# Patient Record
Sex: Female | Born: 1950
Health system: Southern US, Community
[De-identification: ages and names within clinical notes are randomized; demographics above are authoritative.]

## PROBLEM LIST (undated history)

## (undated) DIAGNOSIS — F419 Anxiety disorder, unspecified: Secondary | ICD-10-CM

## (undated) DIAGNOSIS — M810 Age-related osteoporosis without current pathological fracture: Secondary | ICD-10-CM

## (undated) DIAGNOSIS — H269 Unspecified cataract: Secondary | ICD-10-CM

## (undated) DIAGNOSIS — E079 Disorder of thyroid, unspecified: Secondary | ICD-10-CM

## (undated) DIAGNOSIS — T7840XA Allergy, unspecified, initial encounter: Secondary | ICD-10-CM

## (undated) DIAGNOSIS — M1990 Unspecified osteoarthritis, unspecified site: Secondary | ICD-10-CM

## (undated) DIAGNOSIS — E7800 Pure hypercholesterolemia, unspecified: Secondary | ICD-10-CM

## (undated) DIAGNOSIS — E039 Hypothyroidism, unspecified: Secondary | ICD-10-CM

## (undated) DIAGNOSIS — E785 Hyperlipidemia, unspecified: Secondary | ICD-10-CM

## (undated) HISTORY — DX: Age-related osteoporosis without current pathological fracture: M81.0

## (undated) HISTORY — DX: Hypothyroidism, unspecified: E03.9

## (undated) HISTORY — DX: Pure hypercholesterolemia, unspecified: E78.00

## (undated) HISTORY — PX: OTHER SURGICAL HISTORY: SHX169

## (undated) HISTORY — DX: Anxiety disorder, unspecified: F41.9

## (undated) HISTORY — DX: Unspecified osteoarthritis, unspecified site: M19.90

## (undated) HISTORY — DX: Hyperlipidemia, unspecified: E78.5

## (undated) HISTORY — DX: Unspecified cataract: H26.9

## (undated) HISTORY — DX: Allergy, unspecified, initial encounter: T78.40XA

## (undated) HISTORY — DX: Disorder of thyroid, unspecified: E07.9

---

## 2000-02-18 ENCOUNTER — Encounter: Admission: RE | Admit: 2000-02-18 | Discharge: 2000-02-18 | Payer: Self-pay | Admitting: Family Medicine

## 2000-02-18 ENCOUNTER — Encounter: Payer: Self-pay | Admitting: Family Medicine

## 2000-09-22 ENCOUNTER — Encounter: Payer: Self-pay | Admitting: Family Medicine

## 2000-09-22 ENCOUNTER — Encounter: Admission: RE | Admit: 2000-09-22 | Discharge: 2000-09-22 | Payer: Self-pay | Admitting: Family Medicine

## 2000-12-26 ENCOUNTER — Encounter (INDEPENDENT_AMBULATORY_CARE_PROVIDER_SITE_OTHER): Payer: Self-pay | Admitting: Specialist

## 2000-12-26 ENCOUNTER — Other Ambulatory Visit: Admission: RE | Admit: 2000-12-26 | Discharge: 2000-12-26 | Payer: Self-pay | Admitting: Obstetrics and Gynecology

## 2001-08-31 ENCOUNTER — Emergency Department (HOSPITAL_COMMUNITY): Admission: EM | Admit: 2001-08-31 | Discharge: 2001-08-31 | Payer: Self-pay | Admitting: *Deleted

## 2001-08-31 ENCOUNTER — Encounter: Payer: Self-pay | Admitting: Emergency Medicine

## 2002-12-19 ENCOUNTER — Encounter: Payer: Self-pay | Admitting: Family Medicine

## 2002-12-19 ENCOUNTER — Ambulatory Visit (HOSPITAL_COMMUNITY): Admission: RE | Admit: 2002-12-19 | Discharge: 2002-12-19 | Payer: Self-pay | Admitting: Family Medicine

## 2003-04-29 ENCOUNTER — Encounter: Payer: Self-pay | Admitting: Family Medicine

## 2003-04-29 ENCOUNTER — Encounter: Admission: RE | Admit: 2003-04-29 | Discharge: 2003-04-29 | Payer: Self-pay | Admitting: Family Medicine

## 2003-04-29 ENCOUNTER — Encounter: Payer: Self-pay | Admitting: Radiology

## 2003-05-20 ENCOUNTER — Encounter: Admission: RE | Admit: 2003-05-20 | Discharge: 2003-05-20 | Payer: Self-pay | Admitting: Family Medicine

## 2003-05-20 ENCOUNTER — Encounter: Payer: Self-pay | Admitting: Family Medicine

## 2003-06-09 ENCOUNTER — Encounter: Admission: RE | Admit: 2003-06-09 | Discharge: 2003-06-09 | Payer: Self-pay | Admitting: Family Medicine

## 2003-06-09 ENCOUNTER — Encounter: Payer: Self-pay | Admitting: Family Medicine

## 2005-11-12 ENCOUNTER — Encounter: Admission: RE | Admit: 2005-11-12 | Discharge: 2005-11-12 | Payer: Self-pay | Admitting: Family Medicine

## 2010-08-20 ENCOUNTER — Emergency Department (HOSPITAL_COMMUNITY): Admission: EM | Admit: 2010-08-20 | Discharge: 2010-08-21 | Payer: Self-pay | Admitting: Emergency Medicine

## 2010-08-27 ENCOUNTER — Ambulatory Visit (HOSPITAL_COMMUNITY): Payer: Self-pay | Admitting: Psychology

## 2010-09-02 ENCOUNTER — Ambulatory Visit (HOSPITAL_COMMUNITY): Payer: Self-pay | Admitting: Psychology

## 2011-01-12 LAB — ETHANOL

## 2011-01-12 LAB — SALICYLATE LEVEL: Salicylate Lvl: <4 mg/dL (ref 2.8–20.0)

## 2011-01-12 LAB — DIFFERENTIAL
Basophils Relative: 1 % (ref 0–1)
Eosinophils Absolute: 0.1 10*3/uL (ref 0.0–0.7)
Eosinophils Relative: 2 % (ref 0–5)
Monocytes Absolute: 0.5 10*3/uL (ref 0.1–1.0)
Monocytes Relative: 8 % (ref 3–12)

## 2011-01-12 LAB — COMPREHENSIVE METABOLIC PANEL
ALT: 21 U/L (ref 0–35)
AST: 27 U/L (ref 0–37)
Albumin: 4.3 g/dL (ref 3.5–5.2)
Alkaline Phosphatase: 98 U/L (ref 39–117)
Chloride: 106 mEq/L (ref 96–112)
Potassium: 3.7 mEq/L (ref 3.5–5.1)
Sodium: 139 mEq/L (ref 135–145)
Total Bilirubin: 0.6 mg/dL (ref 0.3–1.2)
Total Protein: 7 g/dL (ref 6.0–8.3)

## 2011-01-12 LAB — CBC
HCT: 43.9 % (ref 36.0–46.0)
Hemoglobin: 14.3 g/dL (ref 12.0–15.0)
MCHC: 32.6 g/dL (ref 30.0–36.0)
MCV: 88.9 fL (ref 78.0–100.0)
WBC: 6 10*3/uL (ref 4.0–10.5)

## 2011-01-12 LAB — CK TOTAL AND CKMB (NOT AT ARMC)
CK, MB: 0.6 ng/mL (ref 0.3–4.0)
Relative Index: INVALID (ref 0.0–2.5)
Total CK: 70 U/L (ref 7–177)

## 2011-01-12 LAB — RAPID URINE DRUG SCREEN, HOSP PERFORMED
Barbiturates: NOT DETECTED
Cocaine: NOT DETECTED
Opiates: NOT DETECTED

## 2011-01-12 LAB — APTT: aPTT: 28 seconds (ref 24–37)

## 2013-03-01 ENCOUNTER — Ambulatory Visit (INDEPENDENT_AMBULATORY_CARE_PROVIDER_SITE_OTHER): Payer: Medicare Other | Admitting: Neurology

## 2013-03-01 ENCOUNTER — Encounter: Payer: Self-pay | Admitting: Neurology

## 2013-03-01 VITALS — BP 117/71 | HR 79 | Ht 65.5 in | Wt 157.0 lb

## 2013-03-01 DIAGNOSIS — R569 Unspecified convulsions: Secondary | ICD-10-CM

## 2013-03-01 DIAGNOSIS — F4310 Post-traumatic stress disorder, unspecified: Secondary | ICD-10-CM

## 2013-03-01 DIAGNOSIS — IMO0001 Reserved for inherently not codable concepts without codable children: Secondary | ICD-10-CM

## 2013-03-01 HISTORY — DX: Post-traumatic stress disorder, unspecified: F43.10

## 2013-03-01 NOTE — Progress Notes (Signed)
[facility]  Provider:  Dr Adonis Ryther Referring Provider: Glori Bickers, MD Primary Care Physician:  Pamelia Hoit, MD  Chief Complaint  Patient presents with  . Neurologic Problem    Sleep Consult...RM#10    HPI:  Ruth Jennings is a 62 y.o. female here as a referral from Dr. Evelene Croon for Ruth Jennings had been [facility] November of 15 2011 by Dr. Delia Heady. Or referral to Dr. said said he was based on a cold stroke in 2011 then the [facility]. The stroke was found to have non-organic features and therefore was considered a possible conversion disorder. The psychiatrist on service saw the patient at the time and confirmed multiform this order. CT angiogram was attempted but was not was suboptimal to technical reasons.  She was sent home but has had persistent intermittent speech difficulties off associated with a left facial droop and left-sided weakness have been spells but her husband describes as loss of muscle on with a loss of awareness. The patient confirmed that she can hear and is able to absorb stimuli but she's not able to respond or react to cells.  The patient is now under psychiatric care of is Dr. Evelene Croon. Dr. Westley Chandler referred the patient for a sudden onset of difficulties with speech articulation followed by loss of muscle tone and drop attacks. Onset of some of these drop attacks seems to be related to postural changes. The patient also confirmed that she has normal sleep and a decreased appetite for over a decade but she has gotten better the last 2 years.    The the patient's husband confirms that there seemed to be possible this autonomic changes in orthostatic fainting is. The social history of the patient is as follows the patient is married she lost one son to suicide and her breasts and event has experienced a traumatic event. The patient became after this the band unable to drive unable to  participate in any social or employment home required activities and has not been working or not in a gainfully employed positions. She is to this day not able to keep house or her from other activities of independent daily living.   Review of Systems: Out of a complete 14 system review, the patient complains of only the following symptoms, and all other reviewed systems are negative.  drop attacks, unrelated to emotion. NOT CATAPLECTIC.  Possible postural/ Orthostatic.  Psychogenic  Eye openig apraxia and speech disorder.    History   Social History  . Marital Status: Single    Spouse Name: N/A    Number of Children: 1  . Years of Education: HS   Occupational History  . BUSINESS OWNER Other    OWN/EMPLOYED AT [employer]   Social History Main Topics  . Smoking status: Never Smoker   . Smokeless tobacco: Not on file  . Alcohol Use: No  . Drug Use: No  . Sexually Active: Not on file   Other Topics Concern  . Not on file   Social History Narrative  . No narrative on file    Family History  Problem Relation Age of Onset  . COPD Father     Past Medical History  Diagnosis Date  . Thyroid disorder   . High cholesterol   . Anxiety     No past surgical history on file.  Current Outpatient Prescriptions  Medication Sig Dispense Refill  .  ALPRAZolam (XANAX) 1 MG tablet Take 1 mg by mouth at bedtime as needed for sleep.      . cyclobenzaprine (FLEXERIL) 10 MG tablet Take 10 mg by mouth 3 (three) times daily as needed for muscle spasms.      Marland Kitchen FLUoxetine (PROZAC) 20 MG capsule Take 20 mg by mouth daily.      Marland Kitchen levothyroxine (SYNTHROID, LEVOTHROID) 75 MCG tablet Take 75 mcg by mouth daily before breakfast.      . pravastatin (PRAVACHOL) 40 MG tablet Take 40 mg by mouth daily.       No current facility-administered medications for this visit.    Allergies as of 03/01/2013  . (No Known Allergies)    Vitals: BP 117/71  Pulse 79  Ht 5' 5.5" (1.664 m)  Wt 157  lb (71.215 kg)  BMI 25.72 kg/m2 Last Weight:  Wt Readings from Last 1 Encounters:  03/01/13 157 lb (71.215 kg)   Last Height:   Ht Readings from Last 1 Encounters:  03/01/13 5' 5.5" (1.664 m)   Physical exam:  General: The patient is  Dazed-  The patient is well groomed. She displayed a spell with  Eye opening apraxia, regressive voice, and rapid breathing here in the office.,  Head: Normocephalic, atraumatic. Neck is supple. Mallampati 2 , neck circumference:14.5 Cardiovascular:  Regular rate and rhythm, without  murmurs or carotid bruit, and without distended neck veins. Respiratory: Lungs are clear to auscultation. Skin:  Without evidence of edema, or rash Trunk: BMI is normal.   patient  Has slumped posture.  Neurologic exam : The patient is awake and alert, oriented to place and time.  Memory subjective impaired , and patient is unable to multitask.  described as intact. There is a normal attention span & concentration ability. Speech is non- fluent without dysarthria, dysphonia or aphasia. Somatiform, conversion.  Mood and affect are inappropriate.  Cranial nerves: Pupils are equal and briskly reactive to light.Extraocular movements  in vertical and horizontal planes intact and without nystagmus. Visual fields by finger perimetry are intact. Hearing to finger rub intact.  Facial sensation intact to fine touch. Facial motor strength is symmetric and tongue and uvula move midline.  Motor exam:  Normal tone and normal muscle bulk and symmetric normal strength in all extremities.  Sensory:  Fine touch, pinprick and vibration were tested in all extremities.   Coordination: Rapid alternating movements in the fingers/hands is tested and normal. Finger-to-nose maneuver deferred.  Gait and station: Unable to comply.   PLAN- no assessment for cataplexy necessary , please follow with psychology/ psychiatry.

## 2013-03-01 NOTE — Patient Instructions (Signed)
return to psychiatry.

## 2015-05-25 ENCOUNTER — Other Ambulatory Visit (HOSPITAL_COMMUNITY): Payer: Self-pay | Admitting: Family Medicine

## 2015-05-25 DIAGNOSIS — Z1231 Encounter for screening mammogram for malignant neoplasm of breast: Secondary | ICD-10-CM

## 2015-06-15 ENCOUNTER — Ambulatory Visit (HOSPITAL_COMMUNITY)
Admission: RE | Admit: 2015-06-15 | Discharge: 2015-06-15 | Disposition: A | Discharge status: Home / Self Care | Payer: PPO | Source: Ambulatory Visit | Attending: Family Medicine | Admitting: Family Medicine

## 2015-06-15 DIAGNOSIS — Z1231 Encounter for screening mammogram for malignant neoplasm of breast: Secondary | ICD-10-CM | POA: Diagnosis present

## 2015-07-16 ENCOUNTER — Telehealth: Payer: Self-pay | Admitting: Neurology

## 2015-07-16 NOTE — Telephone Encounter (Signed)
I talked to the patient's psychiatrist, Dr. Toy Care today about this patient. She had seen Dr. Leonie Man in the past for TIA workup as I understand. She then saw Dr. Brett Fairy in May 2014 for falling out spells, concern for cataplexy. The patient has a history of depression and prior concern for conversion disorder, for which she was advised to seek psychiatric follow-up in counseling. She has not received counseling on a regular basis according to the psychiatrist. When she was in Dr. Starleen Arms office recently she suddenly had what sounds like speech arrest and fell out of the chair. Dr. Toy Care is requesting further advice about how to proceed with the patient. I suggested it may be best for the patient to see Dr. Brett Fairy again. I did advise Dr. Toy Care, that the description of the event and the timeline of the event sounded less likely than a cataplectic events but a syncopal event or convulsive event could not be excluded. Dr. Toy Care had a brief video clip captured on her phone and asked for work email to forward the video clip for further description. I gave her Dr. Edwena Felty work Leisure centre manager. I did ask her to send a referral so that patient could be scheduled again.

## 2015-11-30 ENCOUNTER — Ambulatory Visit (INDEPENDENT_AMBULATORY_CARE_PROVIDER_SITE_OTHER): Payer: PPO | Admitting: Neurology

## 2015-11-30 ENCOUNTER — Encounter: Payer: Self-pay | Admitting: Neurology

## 2015-11-30 VITALS — BP 120/80 | HR 58 | Resp 20 | Ht 66.0 in | Wt 138.0 lb

## 2015-11-30 DIAGNOSIS — G47411 Narcolepsy with cataplexy: Secondary | ICD-10-CM

## 2015-11-30 DIAGNOSIS — F482 Pseudobulbar affect: Secondary | ICD-10-CM

## 2015-11-30 DIAGNOSIS — R0683 Snoring: Secondary | ICD-10-CM

## 2015-11-30 DIAGNOSIS — R5382 Chronic fatigue, unspecified: Secondary | ICD-10-CM | POA: Diagnosis not present

## 2015-11-30 MED ORDER — IMIPRAMINE HCL 25 MG PO TABS: 25.0000 mg | ORAL_TABLET | Freq: Every day | ORAL | Status: DC

## 2015-11-30 NOTE — Progress Notes (Signed)
Ruth Age, MD ; I talked to the patient's psychiatrist, Dr. Toy Care today about this patient. She had seen Dr. Leonie Jennings in the past for TIA workup as I understand. She then saw Dr. Brett Jennings in May 2014 for falling out spells, concern for cataplexy. The patient has a history of depression and prior concern for conversion disorder, for which she was advised to seek psychiatric follow-up in counseling. She has not received counseling on a regular basis according to the psychiatrist. When she was in Dr. Starleen Arms office recently she suddenly had what sounds like speech arrest and fell out of the chair. Dr. Toy Care is requesting further advice about how to proceed with the patient. I suggested it may be best for the patient to see Dr. Brett Jennings again. I did advise Dr. Toy Care, that the description of the event and the timeline of the event sounded less likely than a cataplectic events but a syncopal event or convulsive event could not be excluded. Dr. Toy Care had a brief video clip captured on her phone and asked for work e-mail to forward the video clip for further description. I gave her Dr. Edwena Jennings work e-mail. I did ask her to send a referral so that patient could be scheduled again.    [facility]   Provider:  Larey Seat, M D  Referring Provider: Dr Toy Care.  Primary Care Physician:  Ruth Seller, MD  Chief Complaint  Patient presents with  . New Patient (Initial Visit)    saw dr. Brett Jennings in 2014, dr. Toy Care referred her back, thinks she may have cataplexy, rm 10, alone    HPI:  Ruth Jennings is a 65 y.o. female , seen here as a referral from Dr. Toy Care ,   Chief complaint according to patient : "something has taken over my body" I have trouble walking and talking ", "I stay indoors,  haven't driven in 6 years". "i don't know when a spell is coming'. " my mornings are really bad"   [name] described spells during which she will hear everything but is unable to answer.  He spells occur  randomly without aura , this seems to be no sequence. There are isolated and do not appear in cuter that do not have a preferred time of day or relationship to her mealtime. Spells are coming on for 6 years now, and Dr Toy Care send a video of a possible cataplectic event.  The spells are also coming on at night while the patient is in bed, she wakes up from these. She starts moaning and louder and louder she gets. The spells at night come on at 2-3 AM lasts much longer, lasting over an hour.   Sleep habits are as follows: The patient goes to bed at 11 PM, sleeping in a separate room from her husband. She denies being afraid of going to bed, [name] describes her bedroom as core, quiet and dark. She likes to watch TV in her bedroom but the machine usually switches off by the time she goes to sleep. She usually goes to bed at 11, the attacks come between 2- 3 AM but not every night, she has no nocturia. Her spouse reports her snoring, has not noted any apnea.  Rises between 5 and 6, some nights with only 4 hours of sleep. Uses Xanax  In oral dissolving form at night if she has trouble to sleep. She never naps.  She is very fatigued but not sleepy, has endorsed the Epworth score at  zero, the FSS at 59 points. She is not happy to be here.   Sleep medical history and family sleep history:  6 years of spells.   Social history: married, homebound by fear.  No ETOH, Caffeine , No tobacco use.   Review of Systems: Out of a complete 14 system review, the patient complains of only the following symptoms, and all other reviewed systems are negative.  #1 fatigue , snoring , fatigue, memory loss , confusion , headache , slurred speech , dysphonia seizures, depression, anxiety, loss of energy, death interest, fear not enough sleep, runny nose. Past medical history anxiety and depression and high cholesterol were endorsed.  Epworth score 0 , Fatigue severity score 59  , depression score deferred.    Social  History   Social History  . Marital Status: Single    Spouse Name: N/A  . Number of Children: 1  . Years of Education: HS   Occupational History  . BUSINESS OWNER Other    OWN/EMPLOYED AT [employer]   Social History Main Topics  . Smoking status: Never Smoker   . Smokeless tobacco: Not on file  . Alcohol Use: No  . Drug Use: No  . Sexual Activity: Not on file   Other Topics Concern  . Not on file   Social History Narrative    Family History  Problem Relation Jennings of Onset  . COPD Father     Past Medical History  Diagnosis Date  . Thyroid disorder   . High cholesterol   . Anxiety     History reviewed. No pertinent past surgical history.  Current Outpatient Prescriptions  Medication Sig Dispense Refill  . ALPRAZolam (XANAX) 1 MG tablet Take 1 mg by mouth at bedtime as needed for sleep.    Marland Kitchen levothyroxine (SYNTHROID, LEVOTHROID) 75 MCG tablet Take 75 mcg by mouth daily before breakfast.    . pravastatin (PRAVACHOL) 40 MG tablet Take 40 mg by mouth daily.     No current facility-administered medications for this visit.    Allergies as of 11/30/2015  . (No Known Allergies)    Vitals: BP 120/80 mmHg  Pulse 58  Resp 20  Ht _0  (1.676 m)  Wt 138 lb (62.596 kg)  BMI 22.28 kg/m2 Last Weight:  Wt Readings from Last 1 Encounters:  11/30/15 138 lb (62.596 kg)   QMG:QQPY mass index is 22.28 kg/(m^2).     Last Height:   Ht Readings from Last 1 Encounters:  11/30/15 _1  (1.676 m)    Physical exam:  General: The patient is awake, alert and appears not in acute distress. The patient is well groomed. Head: Normocephalic, atraumatic. Neck is supple. Mallampati  neck circumference:14 . Nasal airflow unrestricted, TMJ is not evident . Retrognathia is not seen.  Cardiovascular:  Regular rate and rhythm, without  murmurs or carotid bruit, and without distended neck veins. Respiratory: Lungs are clear to auscultation. Skin:  Without evidence of edema, or  rash Trunk: BMI is normal  The patient's posture is erect  Neurologic exam : The patient is awake and alert, oriented to place and time.   Memory subjective described as intact.  Attention span & concentration ability appears normal.  Speech with dysarthria, dysphonia and halting speech. Mood and affect are appropriate.  Cranial nerves: Pupils are equal and briskly reactive to light. Funduscopic exam without  evidence of pallor or edema.  Extraocular movements  in vertical and horizontal planes intact and without nystagmus. Visual fields by  finger perimetry are intact. Hearing to finger rub intact. Facial sensation intact to fine touch.Facial motor strength is symmetric and tongue and uvula move midline. Shoulder shrug was symmetrical.   Motor exam:  Normal tone, muscle bulk and symmetric strength in all extremities.  Sensory:  Fine touch, pinprick and vibration were  tested in the upper extremities was normal.  Coordination: Rapid alternating movements in the fingers/hands was normal. Finger-to-nose maneuver  normal without evidence of ataxia, dysmetria or tremor.  Gait and station: Patient walks without assistive device and is able unassisted to climb up to the exam table. Strength within normal limits.  Stance is stable and normal.   Deep tendon reflexes: in the  upper and lower extremities are symmetric and intact. Babinski maneuver response is  down going.  The patient was advised of the nature of the diagnosed sleep disorder , the treatment options and risks for general a health and wellness arising from not treating the condition.  I spent more than 45 minutes of face to face time with the patient. Greater than 50% of time was spent in counseling and coordination of care. We have discussed the diagnosis and differential and I answered the patient's questions.     Assessment:  After physical and neurologic examination, review of laboratory studies,  Personal review of imaging  studies, reports of other /same  Imaging studies ,  Results of polysomnography/ neurophysiology testing and pre-existing records as far as provided in visit., my assessment is   1) anxiety, state of fear - with recurrent loss of general muscle tone over the last 6 years. The patient had a spell in my presence , that was not cataplectic , she was repetitively mumbling the same syllable.  and she had muscle tone when i took her hand but allowed it to drop when I stopped touching her.   2) general loss= loss of complete muscular function - very unlikely for cataplexy   3)spell duration is unusual over an hour for cataplexy.   Plan:  Treatment plan and additional workup : psychiatric cause for this spell is self evident. Had other attacks that were possibly more cataplectic.  HLA test for narcolepsy with cataplexy .Trial period of Tofranil.  HST for snoring.     Asencion Partridge Dawsen Krieger MD  11/30/2015   CC: Christain Sacramento, Md [address] [place], [place] [address]

## 2015-11-30 NOTE — Patient Instructions (Addendum)
Cataplexy without associated narcolepsy was the main difficulty in addressing the sudden loss of muscle tone in this patient. There is none described in the medical literature. The patient is alert and friendly but highly anxious. Imipramine tablets What is this medicine? IMIPRAMINE (im IP ra meen) is used to treat depression. This medicine can also help children who have a nighttime bed wetting problem. This medicine may be used for other purposes; ask your health care provider or pharmacist if you have questions. What should I tell my health care provider before I take this medicine? They need to know if you have any of these conditions: -an alcohol problem -asthma, difficulty breathing -bipolar disorder or schizophrenia -difficulty passing urine, prostate trouble -fast or irregular heart beat -glaucoma -heart disease or recent heart attack -kidney or liver disease -seizures -stroke -thoughts or plans of suicide, a previous suicide attempt, or family history of suicide attempt -thyroid disease -an unusual or allergic reaction to imipramine, other medicines, foods, dyes, or preservatives -pregnant or trying to get pregnant -breast-feeding How should I use this medicine? Take this medicine by mouth with a glass of water. Follow the directions on the prescription label. Take your doses at regular intervals. Do not take your medicine more often than directed. Do not stop taking this medicine suddenly except upon the advice of your doctor. Stopping this medicine too quickly may cause serious side effects or your condition may worsen. A special MedGuide will be given to you by the pharmacist with each prescription and refill. Be sure to read this information carefully each time. Talk to your pediatrician regarding the use of this medicine in children. Special care may be needed. While this drug may be prescribed for children as young as 6 years for selected conditions, precautions do  apply. Overdosage: If you think you have taken too much of this medicine contact a poison control center or emergency room at once. NOTE: This medicine is only for you. Do not share this medicine with others. What if I miss a dose? If you miss a dose, take it as soon as you can. If it is almost time for your next dose, take only that dose. Do not take double or extra doses. What may interact with this medicine? Do not take this medicine with any of the following medications: -amoxapine -arsenic trioxide -certain medicines used to regulate abnormal heartbeat or to treat other heart conditions -cisapride -cocaine -grepafloxacin -halofantrine -levomethadyl -linezolid -MAOIs like Carbex, Eldepryl, Marplan, Nardil, and Parnate -methylene blue (injected into a vein) -other medicines for mental depression -phenothiazines like perphenazine, thioridazine and chlorpromazine -pimozide -procarbazine -sparfloxacin -St. John's Wort -ziprasidone This medicine may also interact with the following medications: -atropine and related drugs like hyoscyamine, scopolamine, tolterodine and others -barbiturate medicines for inducing sleep or treating seizures like phenobarbital -cimetidine -clonidine -local anesthetics -medicines for high blood pressure -prescription pain medications -seizure or epilepsy medicine such as carbamazepine or phenytoin -stimulants like dexmethylphenidate or methylphenidate -thyroid hormones This list may not describe all possible interactions. Give your health care provider a list of all the medicines, herbs, non-prescription drugs, or dietary supplements you use. Also tell them if you smoke, drink alcohol, or use illegal drugs. Some items may interact with your medicine. What should I watch for while using this medicine? Tell your doctor if your symptoms do not get better or if they get worse. Visit your doctor or health care professional for regular checks on your  progress. Because it may take several weeks to  see the full effects of this medicine, it is important to continue your treatment as prescribed by your doctor. Patients and their families should watch out for new or worsening thoughts of suicide or depression. Also watch out for sudden changes in feelings such as feeling anxious, agitated, panicky, irritable, hostile, aggressive, impulsive, severely restless, overly excited and hyperactive, or not being able to sleep. If this happens, especially at the beginning of treatment or after a change in dose, call your health care professional. Dennis Bast may get drowsy or dizzy. Do not drive, use machinery, or do anything that needs mental alertness until you know how this medicine affects you. Do not stand or sit up quickly, especially if you are an older patient. This reduces the risk of dizzy or fainting spells. Alcohol may interfere with the effect of this medicine. Avoid alcoholic drinks. Do not treat yourself for coughs, colds, or allergies without asking your doctor or health care professional for advice. Some ingredients can increase possible side effects. Your mouth may get dry. Chewing sugarless gum or sucking hard candy, and drinking plenty of water may help. Contact your doctor if the problem does not go away or is severe. This medicine may cause dry eyes and blurred vision. If you wear contact lenses you may feel some discomfort. Lubricating drops may help. See your eye doctor if the problem does not go away or is severe. This medicine can cause constipation. Try to have a bowel movement at least every 2 to 3 days. If you do not have a bowel movement for 3 days, call your doctor or health care professional. This medicine can make you more sensitive to the sun. Keep out of the sun. If you cannot avoid being in the sun, wear protective clothing and use sunscreen. Do not use sun lamps or tanning beds/booths. What side effects may I notice from receiving this  medicine? Side effects that you should report to your doctor or health care professional as soon as possible: -allergic reactions like skin rash, itching or hives, swelling of the face, lips, or tongue -abnormal production of milk in females -breast enlargement in both males and females -breathing problems -confusion, hallucinations -fever with increased sweating -irregular or fast, pounding heartbeat -muscle stiffness or spasms -pain or difficulty passing urine, loss of bladder control -seizures -suicidal thoughts or other mood changes -swelling of the testicles -tingling, pain, or numbness in the feet or hands -yellowing of the eyes or skin Side effects that usually do not require medical attention (report to your doctor or health care professional if they continue or are bothersome): -change in sex drive or performance -constipation or diarrhea -nausea, vomiting -weight gain or loss This list may not describe all possible side effects. Call your doctor for medical advice about side effects. You may report side effects to FDA at 1-800-FDA-1088. Where should I keep my medicine? Keep out of the reach of children. Store at room temperature between 20 and 25 degrees C (68 and 77 degrees F). Keep container tightly closed. Throw away any unused medicine after the expiration date. NOTE: This sheet is a summary. It may not cover all possible information. If you have questions about this medicine, talk to your doctor, pharmacist, or health care provider.    2016, Elsevier/Gold Standard. (2012-03-05 13:56:42)

## 2015-12-08 ENCOUNTER — Telehealth: Payer: Self-pay

## 2015-12-08 NOTE — Telephone Encounter (Signed)
i like for Dr Toy Care to prescribe the TOFRANIL.  Narcolepsy work up was negative, no organic sleep disorder documented.

## 2015-12-08 NOTE — Telephone Encounter (Signed)
Spoke to pt and advised her that her HLA test for narcolepsy is normal. Pt verbalized understanding.   Pt wants to know if Dr. Brett Fairy will continue prescribing the tofranil or should Dr. Toy Care (referring physician) take over prescribing it? She reports that the tofranil is "really helping her, and I think this will be a big help."

## 2015-12-09 DIAGNOSIS — L570 Actinic keratosis: Secondary | ICD-10-CM | POA: Diagnosis not present

## 2015-12-09 DIAGNOSIS — L814 Other melanin hyperpigmentation: Secondary | ICD-10-CM | POA: Diagnosis not present

## 2015-12-09 NOTE — Telephone Encounter (Signed)
Spoke to pt and advised her that Dr. Brett Fairy would like Dr. Toy Care to prescribe the tofranil for pt going forward since the HLA test was negative for narcolepsy and no organic sleep disorder was documented. Pt verbalized understanding. Pt asked me to fax notes from Dr. Edwena Felty office visit to Dr. Toy Care.

## 2016-01-13 DIAGNOSIS — G4700 Insomnia, unspecified: Secondary | ICD-10-CM | POA: Insufficient documentation

## 2016-01-13 DIAGNOSIS — F4322 Adjustment disorder with anxiety: Secondary | ICD-10-CM | POA: Insufficient documentation

## 2016-01-13 DIAGNOSIS — E785 Hyperlipidemia, unspecified: Secondary | ICD-10-CM | POA: Insufficient documentation

## 2016-01-13 DIAGNOSIS — E039 Hypothyroidism, unspecified: Secondary | ICD-10-CM | POA: Insufficient documentation

## 2016-01-13 DIAGNOSIS — F32A Depression, unspecified: Secondary | ICD-10-CM | POA: Insufficient documentation

## 2016-01-13 DIAGNOSIS — E538 Deficiency of other specified B group vitamins: Secondary | ICD-10-CM | POA: Insufficient documentation

## 2016-01-13 DIAGNOSIS — G47411 Narcolepsy with cataplexy: Secondary | ICD-10-CM | POA: Insufficient documentation

## 2016-01-13 DIAGNOSIS — E559 Vitamin D deficiency, unspecified: Secondary | ICD-10-CM | POA: Insufficient documentation

## 2016-01-15 DIAGNOSIS — E7800 Pure hypercholesterolemia, unspecified: Secondary | ICD-10-CM | POA: Diagnosis not present

## 2016-01-15 DIAGNOSIS — E538 Deficiency of other specified B group vitamins: Secondary | ICD-10-CM | POA: Diagnosis not present

## 2016-01-15 DIAGNOSIS — E039 Hypothyroidism, unspecified: Secondary | ICD-10-CM | POA: Diagnosis not present

## 2016-04-18 DIAGNOSIS — E039 Hypothyroidism, unspecified: Secondary | ICD-10-CM | POA: Diagnosis not present

## 2016-04-18 DIAGNOSIS — K5900 Constipation, unspecified: Secondary | ICD-10-CM | POA: Diagnosis not present

## 2016-04-18 DIAGNOSIS — Z86010 Personal history of colonic polyps: Secondary | ICD-10-CM | POA: Diagnosis not present

## 2016-04-19 DIAGNOSIS — K5900 Constipation, unspecified: Secondary | ICD-10-CM | POA: Diagnosis not present

## 2016-04-26 DIAGNOSIS — Z1211 Encounter for screening for malignant neoplasm of colon: Secondary | ICD-10-CM | POA: Diagnosis not present

## 2016-04-26 DIAGNOSIS — Z86010 Personal history of colonic polyps: Secondary | ICD-10-CM | POA: Diagnosis not present

## 2016-04-26 DIAGNOSIS — D123 Benign neoplasm of transverse colon: Secondary | ICD-10-CM | POA: Diagnosis not present

## 2016-04-26 DIAGNOSIS — K635 Polyp of colon: Secondary | ICD-10-CM | POA: Diagnosis not present

## 2016-04-26 DIAGNOSIS — D127 Benign neoplasm of rectosigmoid junction: Secondary | ICD-10-CM | POA: Diagnosis not present

## 2016-05-26 ENCOUNTER — Other Ambulatory Visit (HOSPITAL_COMMUNITY): Payer: Self-pay | Admitting: Family Medicine

## 2016-05-26 DIAGNOSIS — Z1231 Encounter for screening mammogram for malignant neoplasm of breast: Secondary | ICD-10-CM

## 2016-06-15 ENCOUNTER — Ambulatory Visit (HOSPITAL_COMMUNITY)
Admission: RE | Admit: 2016-06-15 | Discharge: 2016-06-15 | Disposition: A | Discharge status: Home / Self Care | Payer: PPO | Source: Ambulatory Visit | Attending: Family Medicine | Admitting: Family Medicine

## 2016-06-15 DIAGNOSIS — Z1231 Encounter for screening mammogram for malignant neoplasm of breast: Secondary | ICD-10-CM | POA: Diagnosis not present

## 2016-06-21 ENCOUNTER — Other Ambulatory Visit: Payer: Self-pay | Admitting: Family Medicine

## 2016-06-21 DIAGNOSIS — R928 Other abnormal and inconclusive findings on diagnostic imaging of breast: Secondary | ICD-10-CM

## 2016-06-23 ENCOUNTER — Other Ambulatory Visit: Payer: Self-pay | Admitting: Family Medicine

## 2016-06-23 DIAGNOSIS — R928 Other abnormal and inconclusive findings on diagnostic imaging of breast: Secondary | ICD-10-CM

## 2016-06-29 ENCOUNTER — Other Ambulatory Visit: Payer: Self-pay | Admitting: Family Medicine

## 2016-06-29 ENCOUNTER — Other Ambulatory Visit (HOSPITAL_COMMUNITY): Payer: Self-pay | Admitting: Family Medicine

## 2016-06-29 DIAGNOSIS — R928 Other abnormal and inconclusive findings on diagnostic imaging of breast: Secondary | ICD-10-CM

## 2016-07-05 ENCOUNTER — Ambulatory Visit (HOSPITAL_COMMUNITY)
Admission: RE | Admit: 2016-07-05 | Discharge: 2016-07-05 | Disposition: A | Discharge status: Home / Self Care | Payer: PPO | Source: Ambulatory Visit | Attending: Family Medicine | Admitting: Family Medicine

## 2016-07-05 DIAGNOSIS — R928 Other abnormal and inconclusive findings on diagnostic imaging of breast: Secondary | ICD-10-CM | POA: Diagnosis not present

## 2016-07-05 DIAGNOSIS — N649 Disorder of breast, unspecified: Secondary | ICD-10-CM | POA: Diagnosis not present

## 2016-07-05 DIAGNOSIS — R921 Mammographic calcification found on diagnostic imaging of breast: Secondary | ICD-10-CM | POA: Insufficient documentation

## 2016-07-05 DIAGNOSIS — N6489 Other specified disorders of breast: Secondary | ICD-10-CM | POA: Diagnosis not present

## 2016-11-29 ENCOUNTER — Other Ambulatory Visit (HOSPITAL_COMMUNITY): Payer: Self-pay | Admitting: Family Medicine

## 2016-11-29 DIAGNOSIS — R928 Other abnormal and inconclusive findings on diagnostic imaging of breast: Secondary | ICD-10-CM

## 2016-11-29 DIAGNOSIS — Z09 Encounter for follow-up examination after completed treatment for conditions other than malignant neoplasm: Secondary | ICD-10-CM

## 2016-11-29 DIAGNOSIS — N6320 Unspecified lump in the left breast, unspecified quadrant: Secondary | ICD-10-CM

## 2016-12-09 ENCOUNTER — Other Ambulatory Visit (HOSPITAL_COMMUNITY): Payer: Self-pay | Admitting: Family Medicine

## 2016-12-09 DIAGNOSIS — Z09 Encounter for follow-up examination after completed treatment for conditions other than malignant neoplasm: Secondary | ICD-10-CM

## 2016-12-09 DIAGNOSIS — N6320 Unspecified lump in the left breast, unspecified quadrant: Secondary | ICD-10-CM

## 2017-01-23 DIAGNOSIS — E538 Deficiency of other specified B group vitamins: Secondary | ICD-10-CM | POA: Diagnosis not present

## 2017-01-23 DIAGNOSIS — E7800 Pure hypercholesterolemia, unspecified: Secondary | ICD-10-CM | POA: Diagnosis not present

## 2017-01-23 DIAGNOSIS — E039 Hypothyroidism, unspecified: Secondary | ICD-10-CM | POA: Diagnosis not present

## 2017-01-23 DIAGNOSIS — E559 Vitamin D deficiency, unspecified: Secondary | ICD-10-CM | POA: Diagnosis not present

## 2017-01-24 ENCOUNTER — Ambulatory Visit (HOSPITAL_COMMUNITY)
Admission: RE | Admit: 2017-01-24 | Discharge: 2017-01-24 | Disposition: A | Discharge status: Home / Self Care | Payer: PPO | Source: Ambulatory Visit | Attending: Family Medicine | Admitting: Family Medicine

## 2017-01-24 DIAGNOSIS — N6320 Unspecified lump in the left breast, unspecified quadrant: Secondary | ICD-10-CM | POA: Diagnosis not present

## 2017-01-24 DIAGNOSIS — R928 Other abnormal and inconclusive findings on diagnostic imaging of breast: Secondary | ICD-10-CM | POA: Diagnosis not present

## 2017-01-24 DIAGNOSIS — Z09 Encounter for follow-up examination after completed treatment for conditions other than malignant neoplasm: Secondary | ICD-10-CM

## 2017-01-26 DIAGNOSIS — F4322 Adjustment disorder with anxiety: Secondary | ICD-10-CM | POA: Diagnosis not present

## 2017-01-26 DIAGNOSIS — N183 Chronic kidney disease, stage 3 unspecified: Secondary | ICD-10-CM | POA: Insufficient documentation

## 2017-01-26 DIAGNOSIS — E039 Hypothyroidism, unspecified: Secondary | ICD-10-CM | POA: Diagnosis not present

## 2017-01-26 DIAGNOSIS — E7800 Pure hypercholesterolemia, unspecified: Secondary | ICD-10-CM | POA: Diagnosis not present

## 2017-01-26 DIAGNOSIS — N289 Disorder of kidney and ureter, unspecified: Secondary | ICD-10-CM | POA: Diagnosis not present

## 2017-01-26 DIAGNOSIS — E538 Deficiency of other specified B group vitamins: Secondary | ICD-10-CM | POA: Diagnosis not present

## 2017-04-18 DIAGNOSIS — S20212A Contusion of left front wall of thorax, initial encounter: Secondary | ICD-10-CM | POA: Diagnosis not present

## 2017-07-24 DIAGNOSIS — Z23 Encounter for immunization: Secondary | ICD-10-CM | POA: Diagnosis not present

## 2017-07-31 DIAGNOSIS — Z124 Encounter for screening for malignant neoplasm of cervix: Secondary | ICD-10-CM | POA: Diagnosis not present

## 2017-07-31 DIAGNOSIS — Z01419 Encounter for gynecological examination (general) (routine) without abnormal findings: Secondary | ICD-10-CM | POA: Diagnosis not present

## 2017-08-16 DIAGNOSIS — E538 Deficiency of other specified B group vitamins: Secondary | ICD-10-CM | POA: Diagnosis not present

## 2017-08-16 DIAGNOSIS — N289 Disorder of kidney and ureter, unspecified: Secondary | ICD-10-CM | POA: Diagnosis not present

## 2017-12-18 DIAGNOSIS — N289 Disorder of kidney and ureter, unspecified: Secondary | ICD-10-CM | POA: Diagnosis not present

## 2017-12-18 DIAGNOSIS — E7800 Pure hypercholesterolemia, unspecified: Secondary | ICD-10-CM | POA: Diagnosis not present

## 2017-12-18 DIAGNOSIS — E039 Hypothyroidism, unspecified: Secondary | ICD-10-CM | POA: Diagnosis not present

## 2017-12-18 DIAGNOSIS — Z0000 Encounter for general adult medical examination without abnormal findings: Secondary | ICD-10-CM | POA: Diagnosis not present

## 2017-12-18 DIAGNOSIS — G8929 Other chronic pain: Secondary | ICD-10-CM | POA: Diagnosis not present

## 2017-12-18 DIAGNOSIS — M25512 Pain in left shoulder: Secondary | ICD-10-CM | POA: Diagnosis not present

## 2018-02-28 ENCOUNTER — Other Ambulatory Visit (HOSPITAL_COMMUNITY): Payer: Self-pay | Admitting: Family Medicine

## 2018-02-28 DIAGNOSIS — R928 Other abnormal and inconclusive findings on diagnostic imaging of breast: Secondary | ICD-10-CM

## 2018-03-06 ENCOUNTER — Ambulatory Visit (HOSPITAL_COMMUNITY)
Admission: RE | Admit: 2018-03-06 | Discharge: 2018-03-06 | Disposition: A | Discharge status: Home / Self Care | Payer: PPO | Source: Ambulatory Visit | Attending: Family Medicine | Admitting: Family Medicine

## 2018-03-06 ENCOUNTER — Encounter (HOSPITAL_COMMUNITY): Payer: Self-pay

## 2018-03-06 DIAGNOSIS — R921 Mammographic calcification found on diagnostic imaging of breast: Secondary | ICD-10-CM | POA: Diagnosis not present

## 2018-03-06 DIAGNOSIS — R928 Other abnormal and inconclusive findings on diagnostic imaging of breast: Secondary | ICD-10-CM

## 2018-03-06 DIAGNOSIS — N6320 Unspecified lump in the left breast, unspecified quadrant: Secondary | ICD-10-CM | POA: Diagnosis not present

## 2018-06-18 ENCOUNTER — Other Ambulatory Visit (HOSPITAL_COMMUNITY): Payer: Self-pay | Admitting: Family Medicine

## 2018-06-21 ENCOUNTER — Other Ambulatory Visit (HOSPITAL_COMMUNITY): Payer: Self-pay | Admitting: Family Medicine

## 2018-06-21 DIAGNOSIS — N63 Unspecified lump in unspecified breast: Secondary | ICD-10-CM

## 2018-06-21 DIAGNOSIS — R921 Mammographic calcification found on diagnostic imaging of breast: Secondary | ICD-10-CM

## 2018-06-26 ENCOUNTER — Ambulatory Visit (HOSPITAL_COMMUNITY)
Admission: RE | Admit: 2018-06-26 | Discharge: 2018-06-26 | Disposition: A | Discharge status: Home / Self Care | Payer: PPO | Source: Ambulatory Visit | Attending: Family Medicine | Admitting: Family Medicine

## 2018-06-26 DIAGNOSIS — N6322 Unspecified lump in the left breast, upper inner quadrant: Secondary | ICD-10-CM | POA: Diagnosis not present

## 2018-06-26 DIAGNOSIS — R921 Mammographic calcification found on diagnostic imaging of breast: Secondary | ICD-10-CM

## 2018-10-11 DIAGNOSIS — N289 Disorder of kidney and ureter, unspecified: Secondary | ICD-10-CM | POA: Diagnosis not present

## 2018-10-11 DIAGNOSIS — M25512 Pain in left shoulder: Secondary | ICD-10-CM | POA: Diagnosis not present

## 2018-10-11 DIAGNOSIS — G8929 Other chronic pain: Secondary | ICD-10-CM | POA: Insufficient documentation

## 2018-10-11 DIAGNOSIS — E538 Deficiency of other specified B group vitamins: Secondary | ICD-10-CM | POA: Diagnosis not present

## 2018-10-11 DIAGNOSIS — E039 Hypothyroidism, unspecified: Secondary | ICD-10-CM | POA: Diagnosis not present

## 2018-10-11 DIAGNOSIS — E7800 Pure hypercholesterolemia, unspecified: Secondary | ICD-10-CM | POA: Diagnosis not present

## 2018-10-11 DIAGNOSIS — M545 Low back pain, unspecified: Secondary | ICD-10-CM | POA: Insufficient documentation

## 2019-03-07 ENCOUNTER — Other Ambulatory Visit (HOSPITAL_COMMUNITY): Payer: Self-pay | Admitting: Family Medicine

## 2019-03-07 DIAGNOSIS — Z1231 Encounter for screening mammogram for malignant neoplasm of breast: Secondary | ICD-10-CM

## 2019-03-28 ENCOUNTER — Other Ambulatory Visit: Payer: Self-pay

## 2019-03-28 ENCOUNTER — Ambulatory Visit (HOSPITAL_COMMUNITY)
Admission: RE | Admit: 2019-03-28 | Discharge: 2019-03-28 | Disposition: A | Discharge status: Home / Self Care | Payer: PPO | Source: Ambulatory Visit | Attending: Family Medicine | Admitting: Family Medicine

## 2019-03-28 DIAGNOSIS — Z1231 Encounter for screening mammogram for malignant neoplasm of breast: Secondary | ICD-10-CM | POA: Diagnosis not present

## 2019-04-10 DIAGNOSIS — M79622 Pain in left upper arm: Secondary | ICD-10-CM | POA: Diagnosis not present

## 2019-04-10 DIAGNOSIS — E7800 Pure hypercholesterolemia, unspecified: Secondary | ICD-10-CM | POA: Diagnosis not present

## 2019-04-10 DIAGNOSIS — Z0000 Encounter for general adult medical examination without abnormal findings: Secondary | ICD-10-CM | POA: Diagnosis not present

## 2019-04-10 DIAGNOSIS — F4322 Adjustment disorder with anxiety: Secondary | ICD-10-CM | POA: Diagnosis not present

## 2019-04-10 DIAGNOSIS — E039 Hypothyroidism, unspecified: Secondary | ICD-10-CM | POA: Diagnosis not present

## 2019-04-10 DIAGNOSIS — G8929 Other chronic pain: Secondary | ICD-10-CM | POA: Insufficient documentation

## 2019-04-10 DIAGNOSIS — M25512 Pain in left shoulder: Secondary | ICD-10-CM | POA: Insufficient documentation

## 2019-05-20 DIAGNOSIS — Z86010 Personal history of colonic polyps: Secondary | ICD-10-CM | POA: Diagnosis not present

## 2019-05-20 DIAGNOSIS — K5900 Constipation, unspecified: Secondary | ICD-10-CM | POA: Diagnosis not present

## 2019-05-20 DIAGNOSIS — Z800 Family history of malignant neoplasm of digestive organs: Secondary | ICD-10-CM | POA: Diagnosis not present

## 2019-05-20 DIAGNOSIS — F4312 Post-traumatic stress disorder, chronic: Secondary | ICD-10-CM | POA: Diagnosis not present

## 2019-06-04 DIAGNOSIS — Z86010 Personal history of colonic polyps: Secondary | ICD-10-CM | POA: Diagnosis not present

## 2019-06-04 DIAGNOSIS — K5730 Diverticulosis of large intestine without perforation or abscess without bleeding: Secondary | ICD-10-CM | POA: Diagnosis not present

## 2019-06-04 LAB — HM COLONOSCOPY

## 2019-07-11 DIAGNOSIS — N183 Chronic kidney disease, stage 3 (moderate): Secondary | ICD-10-CM | POA: Diagnosis not present

## 2019-07-11 DIAGNOSIS — M25512 Pain in left shoulder: Secondary | ICD-10-CM | POA: Diagnosis not present

## 2019-07-11 DIAGNOSIS — G8929 Other chronic pain: Secondary | ICD-10-CM | POA: Diagnosis not present

## 2019-07-11 DIAGNOSIS — E538 Deficiency of other specified B group vitamins: Secondary | ICD-10-CM | POA: Diagnosis not present

## 2019-07-11 DIAGNOSIS — E7800 Pure hypercholesterolemia, unspecified: Secondary | ICD-10-CM | POA: Diagnosis not present

## 2019-07-11 DIAGNOSIS — E039 Hypothyroidism, unspecified: Secondary | ICD-10-CM | POA: Diagnosis not present

## 2019-07-31 DIAGNOSIS — Z23 Encounter for immunization: Secondary | ICD-10-CM | POA: Diagnosis not present

## 2020-01-09 DIAGNOSIS — E7800 Pure hypercholesterolemia, unspecified: Secondary | ICD-10-CM | POA: Diagnosis not present

## 2020-01-09 DIAGNOSIS — N1831 Chronic kidney disease, stage 3a: Secondary | ICD-10-CM | POA: Diagnosis not present

## 2020-01-09 DIAGNOSIS — F5102 Adjustment insomnia: Secondary | ICD-10-CM | POA: Diagnosis not present

## 2020-01-09 DIAGNOSIS — M25551 Pain in right hip: Secondary | ICD-10-CM | POA: Diagnosis not present

## 2020-01-09 DIAGNOSIS — E039 Hypothyroidism, unspecified: Secondary | ICD-10-CM | POA: Diagnosis not present

## 2020-01-09 DIAGNOSIS — E538 Deficiency of other specified B group vitamins: Secondary | ICD-10-CM | POA: Diagnosis not present

## 2020-01-09 DIAGNOSIS — F4322 Adjustment disorder with anxiety: Secondary | ICD-10-CM | POA: Diagnosis not present

## 2020-01-19 DIAGNOSIS — M25551 Pain in right hip: Secondary | ICD-10-CM | POA: Insufficient documentation

## 2020-04-29 ENCOUNTER — Other Ambulatory Visit (HOSPITAL_COMMUNITY): Payer: Self-pay | Admitting: Family Medicine

## 2020-04-29 DIAGNOSIS — Z1231 Encounter for screening mammogram for malignant neoplasm of breast: Secondary | ICD-10-CM

## 2020-05-13 ENCOUNTER — Ambulatory Visit (HOSPITAL_COMMUNITY)
Admission: RE | Admit: 2020-05-13 | Discharge: 2020-05-13 | Disposition: A | Discharge status: Home / Self Care | Payer: PPO | Source: Ambulatory Visit | Attending: Family Medicine | Admitting: Family Medicine

## 2020-05-13 ENCOUNTER — Other Ambulatory Visit: Payer: Self-pay

## 2020-05-13 DIAGNOSIS — Z1231 Encounter for screening mammogram for malignant neoplasm of breast: Secondary | ICD-10-CM | POA: Diagnosis not present

## 2020-07-07 DIAGNOSIS — F329 Major depressive disorder, single episode, unspecified: Secondary | ICD-10-CM | POA: Diagnosis not present

## 2020-07-07 DIAGNOSIS — Z0000 Encounter for general adult medical examination without abnormal findings: Secondary | ICD-10-CM | POA: Diagnosis not present

## 2020-07-07 DIAGNOSIS — M25512 Pain in left shoulder: Secondary | ICD-10-CM | POA: Diagnosis not present

## 2020-07-07 DIAGNOSIS — G8929 Other chronic pain: Secondary | ICD-10-CM | POA: Diagnosis not present

## 2020-07-07 DIAGNOSIS — E7800 Pure hypercholesterolemia, unspecified: Secondary | ICD-10-CM | POA: Diagnosis not present

## 2020-07-07 DIAGNOSIS — M152 Bouchard's nodes (with arthropathy): Secondary | ICD-10-CM | POA: Diagnosis not present

## 2020-07-07 DIAGNOSIS — F5102 Adjustment insomnia: Secondary | ICD-10-CM | POA: Diagnosis not present

## 2020-07-07 DIAGNOSIS — F4322 Adjustment disorder with anxiety: Secondary | ICD-10-CM | POA: Diagnosis not present

## 2020-07-07 DIAGNOSIS — E039 Hypothyroidism, unspecified: Secondary | ICD-10-CM | POA: Diagnosis not present

## 2020-07-23 ENCOUNTER — Encounter: Payer: Self-pay | Admitting: Physician Assistant

## 2020-07-23 ENCOUNTER — Other Ambulatory Visit: Payer: Self-pay

## 2020-07-23 ENCOUNTER — Ambulatory Visit: Payer: PPO | Admitting: Physician Assistant

## 2020-07-23 DIAGNOSIS — L739 Follicular disorder, unspecified: Secondary | ICD-10-CM

## 2020-07-23 DIAGNOSIS — D485 Neoplasm of uncertain behavior of skin: Secondary | ICD-10-CM

## 2020-07-23 DIAGNOSIS — Z1283 Encounter for screening for malignant neoplasm of skin: Secondary | ICD-10-CM

## 2020-07-23 DIAGNOSIS — L570 Actinic keratosis: Secondary | ICD-10-CM

## 2020-07-23 DIAGNOSIS — I781 Nevus, non-neoplastic: Secondary | ICD-10-CM | POA: Diagnosis not present

## 2020-07-23 MED ORDER — CLOBETASOL PROPIONATE 0.05 % EX SOLN: 1.0000 "application " | Freq: Two times a day (BID) | CUTANEOUS | 0 refills | Status: DC

## 2020-07-23 NOTE — Progress Notes (Signed)
   New Patient   Subjective  Ruth Jennings is a 69 y.o. female who presents for the following: Annual Exam (RIGHT INNER NARE X MONTHS NONHEALING, SCALP CRUST OTC SELSUN BLUE).   The following portions of the chart were reviewed this encounter and updated as appropriate: Tobacco  Allergies  Meds  Problems  Med Hx  Surg Hx  Fam Hx      Objective  Well appearing patient in no apparent distress; mood and affect are within normal limits.  A full examination was performed including scalp, head, eyes, ears, nose, lips, neck, chest, axillae, abdomen, back, buttocks, bilateral upper extremities, bilateral lower extremities, hands, feet, fingers, toes, fingernails, and toenails. All findings within normal limits unless otherwise noted below.  Objective  Columella of Nose: Hyperkeratotic scale with pink base      Objective  Mid Parietal Scalp: Small papules pink and itchy  Objective  Left Forearm - Anterior: Erythematous patches with gritty scale.   Assessment & Plan  Neoplasm of uncertain behavior of skin Columella of Nose  Skin / nail biopsy Type of biopsy: tangential   Informed consent: discussed and consent obtained   Timeout: patient name, date of birth, surgical site, and procedure verified   Procedure prep:  Patient was prepped and draped in usual sterile fashion (Non sterile) Prep type:  Chlorhexidine Anesthesia: the lesion was anesthetized in a standard fashion   Anesthetic:  1% lidocaine w/ epinephrine 1-100,000 local infiltration Instrument used: flexible razor blade   Outcome: patient tolerated procedure well   Post-procedure details: wound care instructions given    Specimen 1 - Surgical pathology Differential Diagnosis: scc vs bcc Check Margins: yes  Folliculitis Mid Parietal Scalp  AK (actinic keratosis) Left Forearm - Anterior  Destruction of lesion - Left Forearm - Anterior Complexity: simple   Destruction method: cryotherapy   Informed  consent: discussed and consent obtained   Timeout:  patient name, date of birth, surgical site, and procedure verified Lesion destroyed using liquid nitrogen: Yes   Cryotherapy cycles:  1 Outcome: patient tolerated procedure well with no complications   Post-procedure details: wound care instructions given       I, Deshana Rominger, PA-C, have reviewed all documentation for this visit. The documentation on 07/23/20 for the exam, diagnosis, procedures, and orders are all accurate and complete.

## 2020-07-23 NOTE — Patient Instructions (Signed)

## 2020-08-04 DIAGNOSIS — Z23 Encounter for immunization: Secondary | ICD-10-CM | POA: Diagnosis not present

## 2020-09-08 IMAGING — MG DIGITAL SCREENING BILATERAL MAMMOGRAM WITH TOMO AND CAD
8 series · 8 of 24 positions shown · non-contrast
Comparison: Previous exam(s).

CLINICAL DATA: Screening.

EXAM:
DIGITAL SCREENING BILATERAL MAMMOGRAM WITH TOMO AND CAD

[R MLO synth-2D]
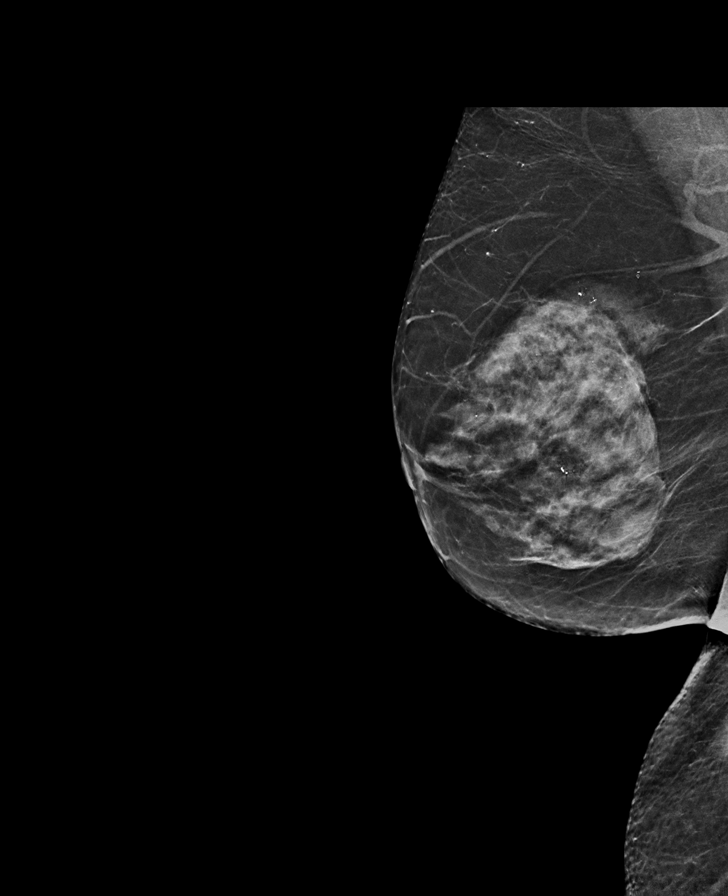

[L MLO synth-2D]
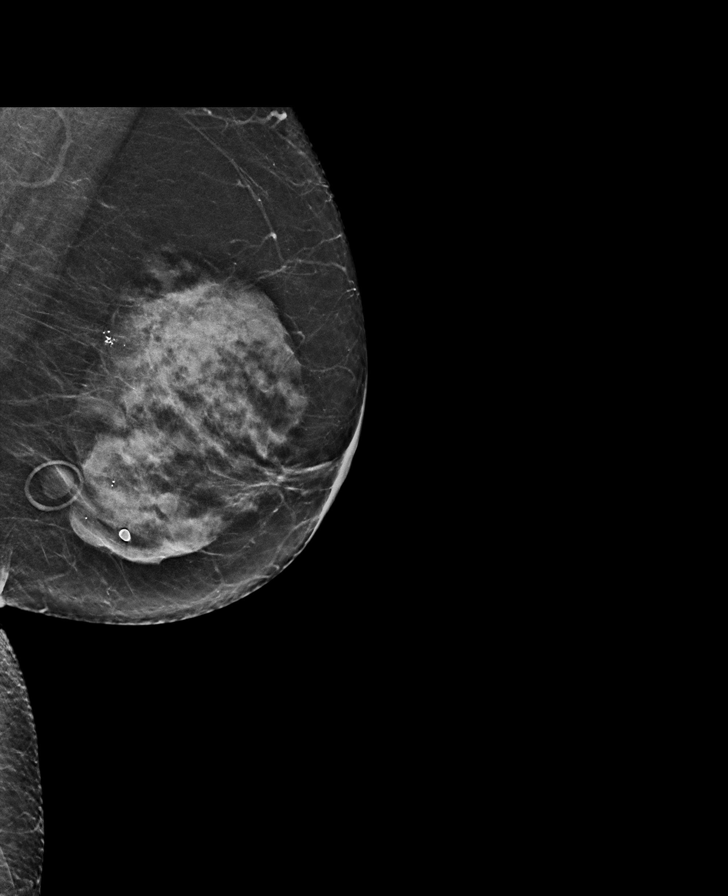

[R CC synth-2D]
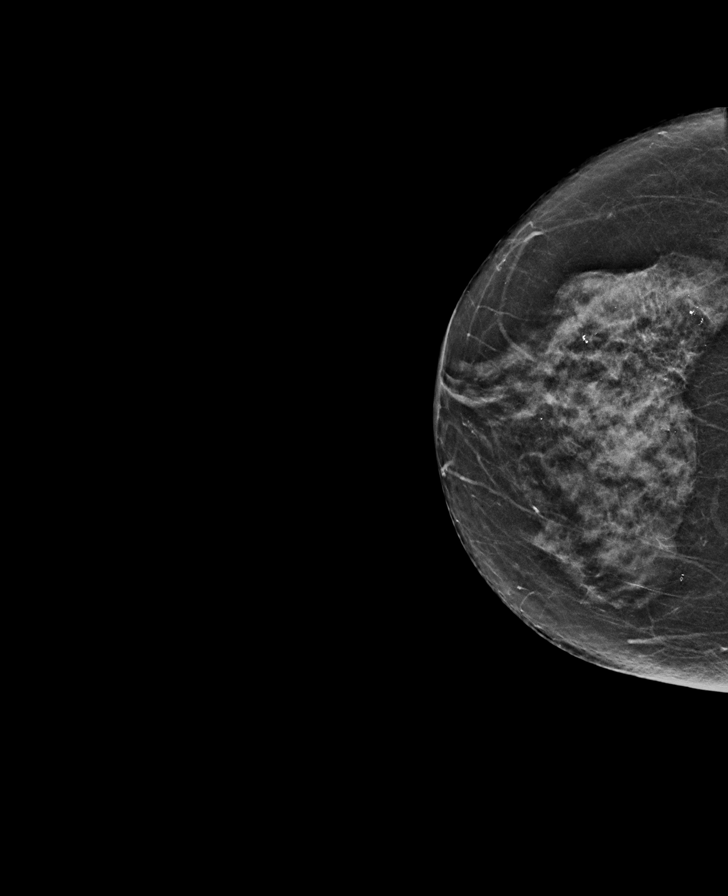

[L CC synth-2D]
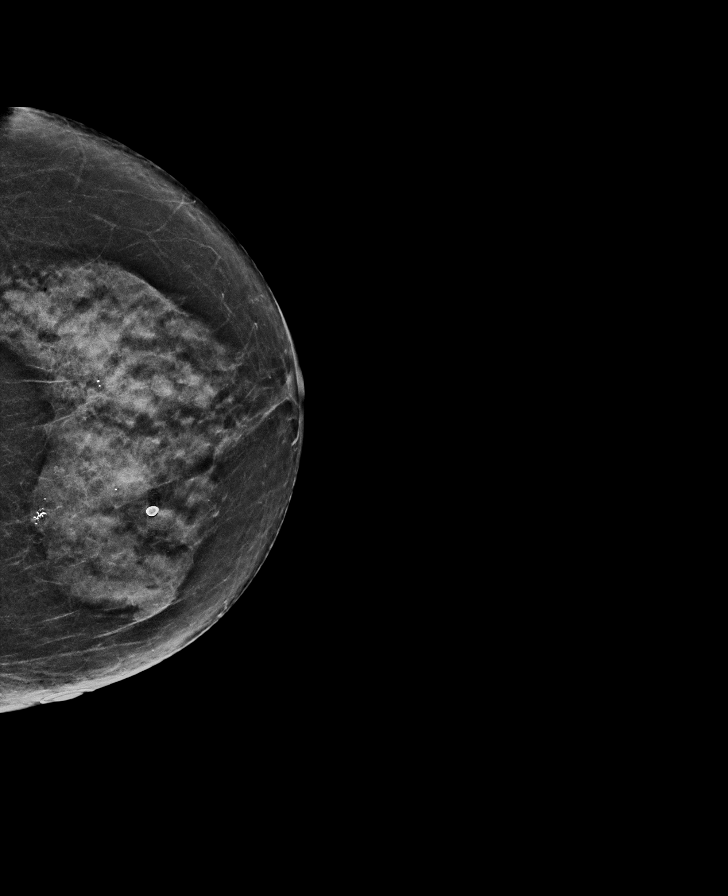

[R MLO tomo · tomo slice 31/61.0]
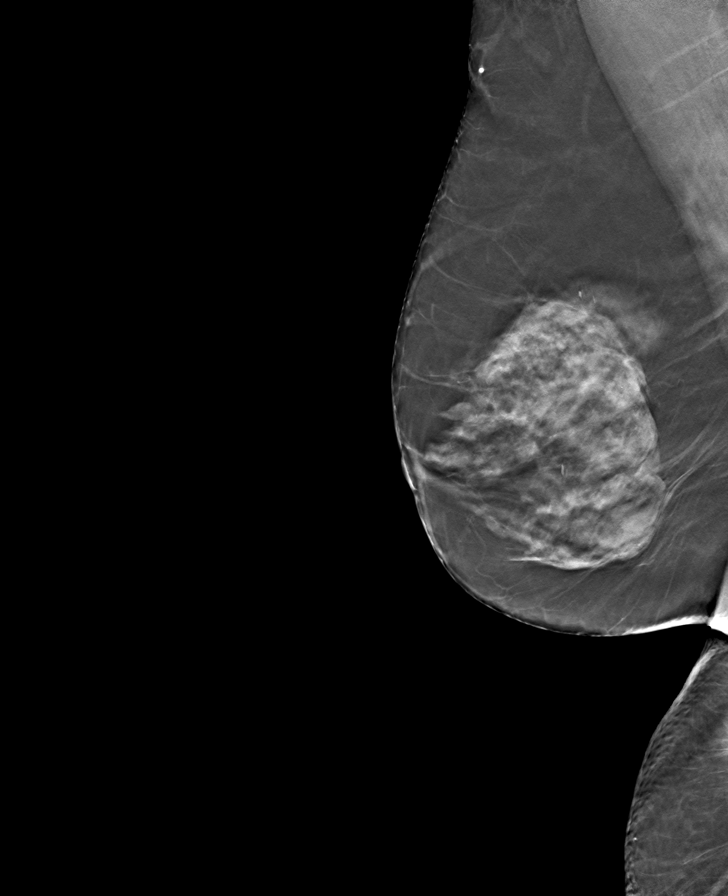

[L CC tomo · tomo slice 28/55.0]
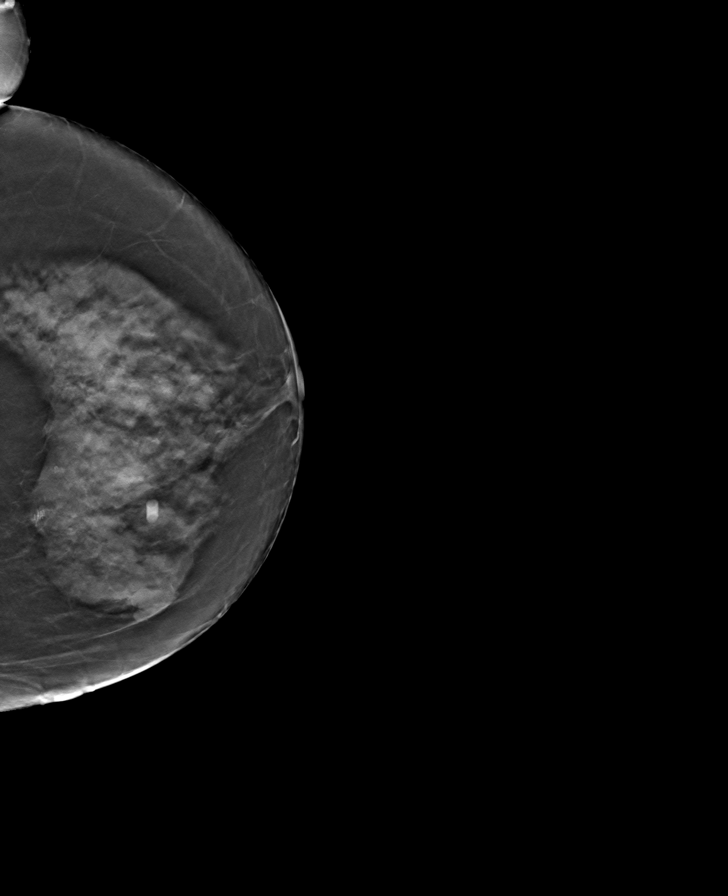

[L MLO tomo · tomo slice 31/61.0]
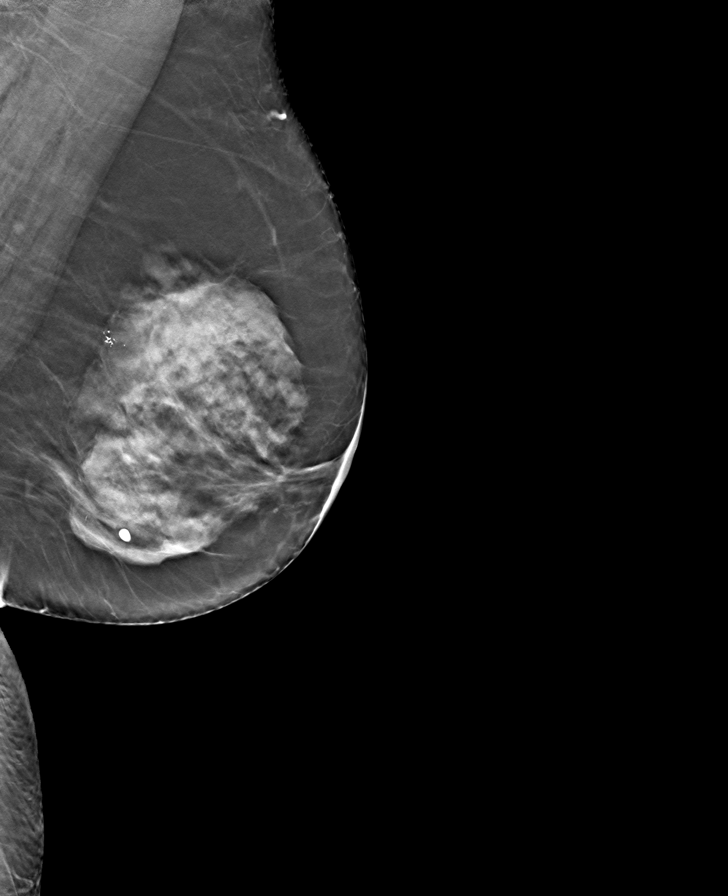

[R CC tomo · tomo slice 27/52.0]
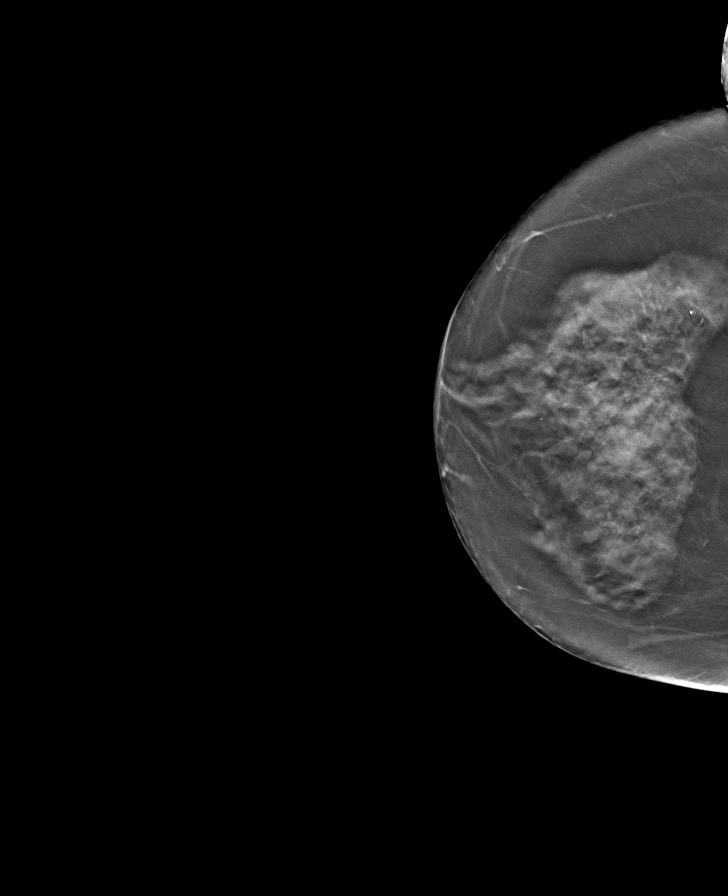

[8 of 24 positions shown; findings below may reference images not displayed]

ACR Breast Density Category c: The breast tissue is heterogeneously
dense, which may obscure small masses.
FINDINGS: There are no findings suspicious for malignancy. Images were
processed with CAD.
IMPRESSION: No mammographic evidence of malignancy. A result letter of this
screening mammogram will be mailed directly to the patient.

RECOMMENDATION:
Screening mammogram in one year. (Code:[id number])

BI-RADS CATEGORY  1: Negative.

## 2020-09-21 DIAGNOSIS — R634 Abnormal weight loss: Secondary | ICD-10-CM | POA: Diagnosis not present

## 2020-09-21 DIAGNOSIS — R109 Unspecified abdominal pain: Secondary | ICD-10-CM | POA: Diagnosis not present

## 2020-09-22 ENCOUNTER — Other Ambulatory Visit (HOSPITAL_COMMUNITY): Payer: Self-pay | Admitting: Family Medicine

## 2020-09-22 ENCOUNTER — Other Ambulatory Visit: Payer: Self-pay | Admitting: Family Medicine

## 2020-09-22 DIAGNOSIS — R109 Unspecified abdominal pain: Secondary | ICD-10-CM

## 2020-09-23 DIAGNOSIS — R634 Abnormal weight loss: Secondary | ICD-10-CM | POA: Diagnosis not present

## 2020-09-23 DIAGNOSIS — R109 Unspecified abdominal pain: Secondary | ICD-10-CM | POA: Diagnosis not present

## 2020-09-28 DIAGNOSIS — K5710 Diverticulosis of small intestine without perforation or abscess without bleeding: Secondary | ICD-10-CM | POA: Diagnosis not present

## 2020-09-28 DIAGNOSIS — R0781 Pleurodynia: Secondary | ICD-10-CM | POA: Diagnosis not present

## 2020-09-28 DIAGNOSIS — R197 Diarrhea, unspecified: Secondary | ICD-10-CM | POA: Diagnosis not present

## 2020-10-06 DIAGNOSIS — R002 Palpitations: Secondary | ICD-10-CM | POA: Diagnosis not present

## 2020-10-06 DIAGNOSIS — E039 Hypothyroidism, unspecified: Secondary | ICD-10-CM | POA: Diagnosis not present

## 2020-10-13 ENCOUNTER — Encounter: Payer: Self-pay | Admitting: Family Medicine

## 2020-11-30 DIAGNOSIS — F419 Anxiety disorder, unspecified: Secondary | ICD-10-CM | POA: Diagnosis not present

## 2020-11-30 DIAGNOSIS — R0781 Pleurodynia: Secondary | ICD-10-CM | POA: Diagnosis not present

## 2020-11-30 DIAGNOSIS — E039 Hypothyroidism, unspecified: Secondary | ICD-10-CM | POA: Diagnosis not present

## 2020-11-30 DIAGNOSIS — R109 Unspecified abdominal pain: Secondary | ICD-10-CM | POA: Diagnosis not present

## 2020-12-02 DIAGNOSIS — R0781 Pleurodynia: Secondary | ICD-10-CM | POA: Diagnosis not present

## 2021-01-12 DIAGNOSIS — R002 Palpitations: Secondary | ICD-10-CM | POA: Diagnosis not present

## 2021-01-12 DIAGNOSIS — F4322 Adjustment disorder with anxiety: Secondary | ICD-10-CM | POA: Diagnosis not present

## 2021-01-12 DIAGNOSIS — R0789 Other chest pain: Secondary | ICD-10-CM | POA: Diagnosis not present

## 2021-01-12 DIAGNOSIS — F5102 Adjustment insomnia: Secondary | ICD-10-CM | POA: Diagnosis not present

## 2021-01-27 ENCOUNTER — Ambulatory Visit (INDEPENDENT_AMBULATORY_CARE_PROVIDER_SITE_OTHER): Payer: PPO | Admitting: Internal Medicine

## 2021-01-27 ENCOUNTER — Encounter: Payer: Self-pay | Admitting: Internal Medicine

## 2021-01-27 ENCOUNTER — Other Ambulatory Visit: Payer: Self-pay

## 2021-01-27 VITALS — BP 119/72 | HR 92 | Resp 18 | Ht 67.0 in | Wt 117.4 lb

## 2021-01-27 DIAGNOSIS — Z800 Family history of malignant neoplasm of digestive organs: Secondary | ICD-10-CM | POA: Insufficient documentation

## 2021-01-27 DIAGNOSIS — Z780 Asymptomatic menopausal state: Secondary | ICD-10-CM

## 2021-01-27 DIAGNOSIS — F419 Anxiety disorder, unspecified: Secondary | ICD-10-CM | POA: Diagnosis not present

## 2021-01-27 DIAGNOSIS — Z1159 Encounter for screening for other viral diseases: Secondary | ICD-10-CM | POA: Diagnosis not present

## 2021-01-27 DIAGNOSIS — R0789 Other chest pain: Secondary | ICD-10-CM

## 2021-01-27 DIAGNOSIS — R002 Palpitations: Secondary | ICD-10-CM | POA: Diagnosis not present

## 2021-01-27 DIAGNOSIS — K5900 Constipation, unspecified: Secondary | ICD-10-CM | POA: Insufficient documentation

## 2021-01-27 DIAGNOSIS — Z7689 Persons encountering health services in other specified circumstances: Secondary | ICD-10-CM | POA: Diagnosis not present

## 2021-01-27 DIAGNOSIS — N1831 Chronic kidney disease, stage 3a: Secondary | ICD-10-CM

## 2021-01-27 DIAGNOSIS — Z860100 Personal history of colon polyps, unspecified: Secondary | ICD-10-CM | POA: Insufficient documentation

## 2021-01-27 DIAGNOSIS — E785 Hyperlipidemia, unspecified: Secondary | ICD-10-CM | POA: Diagnosis not present

## 2021-01-27 DIAGNOSIS — G4700 Insomnia, unspecified: Secondary | ICD-10-CM | POA: Diagnosis not present

## 2021-01-27 DIAGNOSIS — E039 Hypothyroidism, unspecified: Secondary | ICD-10-CM | POA: Diagnosis not present

## 2021-01-27 DIAGNOSIS — K5710 Diverticulosis of small intestine without perforation or abscess without bleeding: Secondary | ICD-10-CM | POA: Insufficient documentation

## 2021-01-27 NOTE — Patient Instructions (Signed)
Continue taking medications as prescribed for now.  You are being referred to Cardiology for palpitations.

## 2021-01-29 DIAGNOSIS — F419 Anxiety disorder, unspecified: Secondary | ICD-10-CM | POA: Insufficient documentation

## 2021-01-29 LAB — BASIC METABOLIC PANEL
BUN/Creatinine Ratio: 18 (ref 12–28)
BUN: 21 mg/dL (ref 8–27)
CO2: 24 mmol/L (ref 20–29)
Calcium: 9.9 mg/dL (ref 8.7–10.3)
Chloride: 100 mmol/L (ref 96–106)
Creatinine, Ser: 1.16 mg/dL — ABNORMAL HIGH (ref 0.57–1.00)
Glucose: 105 mg/dL — ABNORMAL HIGH (ref 65–99)
Potassium: 4.6 mmol/L (ref 3.5–5.2)
Sodium: 141 mmol/L (ref 134–144)
eGFR: 51 mL/min/{1.73_m2} — ABNORMAL LOW (ref >59)

## 2021-01-29 LAB — TSH+FREE T4
Free T4: 1.45 ng/dL (ref 0.82–1.77)
TSH: 2.5 u[IU]/mL (ref 0.450–4.500)

## 2021-01-29 NOTE — Assessment & Plan Note (Signed)
On statin.

## 2021-01-29 NOTE — Assessment & Plan Note (Signed)
EKG was sinus rhythm in the past with no signs of active ischemia. Her Levothyroxine dose was adjusted considering possible overcorrection, but she still has palpitations. If persistent symptoms, will refer to Cardiology

## 2021-01-29 NOTE — Assessment & Plan Note (Signed)
Xanax 1 mg QD PRN

## 2021-01-29 NOTE — Progress Notes (Signed)
New Patient Office Visit  Subjective:  Patient ID: Ruth Jennings, female    DOB: July 12, 1951  Age: 70 y.o. MRN: [id number]  CC:  Chief Complaint  Patient presents with  . New Patient (Initial Visit)    New patient has been having pain on left side and palpatation feeling on left side since November last pcp ordered ekg and scans but found nothing     HPI Ruth Jennings is a 70 year old female with PMH of hypothyroidism, CKD stage 3a, anxiety and HLD who presents for establishing care.  She has been having palpitations associated with LLQ pain radiating to left side of chest wall. It is sharp, intermittent, lasts for few mins. She has had EKG done, which she was told was normal. She has had adjustment in her Levothyroxine dose as well to help with it, but does not make any difference. She was told take Levothyroxine only 5 days in a week although her TSH was normal. She denies any dyspnea or chest pressure.  She uses Voltaren for OA.  She takes Xanax for anxiety and insomnia.  She is up-to-date with COVID vaccine.  Past Medical History:  Diagnosis Date  . Anxiety   . High cholesterol   . Posttraumatic stress disorder 03/01/2013  . Thyroid disorder     History reviewed. No pertinent surgical history.  Family History  Problem Relation Age of Onset  . COPD Father     Social History   Socioeconomic History  . Marital status: Single    Spouse name: Not on file  . Number of children: 1  . Years of education: HS  . Highest education level: Not on file  Occupational History  . Occupation: Occupational hygienist: OTHER    Comment: OWN/EMPLOYED AT [employer]  Tobacco Use  . Smoking status: Never Smoker  . Smokeless tobacco: Never Used  Vaping Use  . Vaping Use: Never used  Substance and Sexual Activity  . Alcohol use: No  . Drug use: No  . Sexual activity: Not on file  Other Topics Concern  . Not on file  Social History Narrative  . Not on file   Social  Determinants of Health   Financial Resource Strain: Not on file  Food Insecurity: Not on file  Transportation Needs: Not on file  Physical Activity: Not on file  Stress: Not on file  Social Connections: Not on file  Intimate Partner Violence: Not on file    ROS Review of Systems  Constitutional: Negative for chills and fever.  HENT: Negative for congestion, sinus pressure, sinus pain and sore throat.   Eyes: Negative for pain and discharge.  Respiratory: Negative for cough and shortness of breath.   Cardiovascular: Positive for palpitations. Negative for chest pain.  Gastrointestinal: Positive for abdominal pain. Negative for constipation, diarrhea, nausea and vomiting.  Endocrine: Negative for polydipsia and polyuria.  Genitourinary: Negative for dysuria and hematuria.  Musculoskeletal: Negative for neck pain and neck stiffness.  Skin: Negative for rash.  Neurological: Negative for dizziness and weakness.  Psychiatric/Behavioral: Negative for agitation and behavioral problems.    Objective:   Today's Vitals: BP 119/72 (BP Location: Right Arm, Patient Position: Sitting, Cuff Size: Normal)   Pulse 92   Resp 18   Ht 5\' 7"  (1.702 m)   Wt 117 lb 6.4 oz (53.3 kg)   SpO2 99%   BMI 18.39 kg/m   Physical Exam Vitals reviewed.  Constitutional:      General:  She is not in acute distress.    Appearance: She is not diaphoretic.  HENT:     Head: Normocephalic and atraumatic.     Nose: Nose normal.     Mouth/Throat:     Mouth: Mucous membranes are moist.  Eyes:     General: No scleral icterus.    Extraocular Movements: Extraocular movements intact.  Cardiovascular:     Rate and Rhythm: Normal rate and regular rhythm.     Pulses: Normal pulses.     Heart sounds: Normal heart sounds. No murmur heard.   Pulmonary:     Breath sounds: Normal breath sounds. No wheezing or rales.  Abdominal:     Palpations: Abdomen is soft.     Tenderness: There is no abdominal tenderness.   Musculoskeletal:     Cervical back: Neck supple. No tenderness.     Right lower leg: No edema.     Left lower leg: No edema.  Skin:    General: Skin is warm.     Findings: No rash.  Neurological:     General: No focal deficit present.     Mental Status: She is alert and oriented to person, place, and time.  Psychiatric:        Mood and Affect: Mood normal.        Behavior: Behavior normal.     Assessment & Plan:   Problem List Items Addressed This Visit      Encounter to establish care    -  Primary Care established Previous chart reviewed History and medications reviewed with the patient  Endocrine   Acquired hypothyroidism    On Levothyroxine 75 mcg QD      Relevant Orders   Basic Metabolic Panel (BMET) (Completed)   TSH + free T4 (Completed)     Genitourinary   CKD (chronic kidney disease) stage 3, GFR 30-59 ml/min (HCC)    Stable Check BMP If worsening kidney function, will refer to Nephrology        Other   Heart palpitations    EKG was sinus rhythm in the past with no signs of active ischemia. Her Levothyroxine dose was adjusted considering possible overcorrection, but she still has palpitations. If persistent symptoms, will refer to Cardiology      Relevant Orders   Basic Metabolic Panel (BMET) (Completed)   Hyperlipidemia    On statin      Insomnia   Left-sided chest wall pain    Intermittent, appears to radiating from LLQ to anterior axillary border Does not seem to cardiac related pain Tylenol PRN      Anxiety    Xanax 1 mg QD PRN       Other Visit Diagnoses       Post-menopausal       Relevant Orders   DG Bone Density   Need for hepatitis C screening test       Relevant Orders   Hepatitis C Antibody      Outpatient Encounter Medications as of 01/27/2021  Medication Sig  . ALPRAZolam (XANAX) 1 MG tablet Take 1 mg by mouth at bedtime as needed for sleep.  . Cholecalciferol 25 MCG (1000 UT) tablet Take 2 tablets by mouth daily.   . cyanocobalamin 1000 MCG tablet Take by mouth.  . diclofenac Sodium (VOLTAREN) 1 % GEL Apply to affected finger 3 times a day as needed for arthritis pain  . levothyroxine (SYNTHROID, LEVOTHROID) 75 MCG tablet Take 75 mcg by mouth daily before breakfast.  .  pravastatin (PRAVACHOL) 40 MG tablet Take 40 mg by mouth daily.  . clobetasol (TEMOVATE) 0.05 % external solution Apply 1 application topically 2 (two) times daily. (Patient not taking: Reported on 01/27/2021)  . [DISCONTINUED] cyclobenzaprine (FLEXERIL) 10 MG tablet Take one tablet nightly, before bedtime. The dose may be repeated during the night if needed. (Patient not taking: Reported on 01/27/2021)  . [DISCONTINUED] imipramine (TOFRANIL) 25 MG tablet Take 1 tablet (25 mg total) by mouth at bedtime. (Patient not taking: Reported on 01/27/2021)  . [DISCONTINUED] mupirocin ointment (BACTROBAN) 2 % 2 (two) times daily. (Patient not taking: Reported on 01/27/2021)   No facility-administered encounter medications on file as of 01/27/2021.    Follow-up: Return in about 4 months (around 05/29/2021).   Lindell Spar, MD

## 2021-01-29 NOTE — Assessment & Plan Note (Signed)
On Levothyroxine 75 mcg QD

## 2021-01-29 NOTE — Assessment & Plan Note (Signed)
Stable Check BMP If worsening kidney function, will refer to Nephrology

## 2021-01-29 NOTE — Assessment & Plan Note (Signed)
Intermittent, appears to radiating from LLQ to anterior axillary border Does not seem to cardiac related pain Tylenol PRN

## 2021-02-04 ENCOUNTER — Ambulatory Visit (HOSPITAL_COMMUNITY)
Admission: RE | Admit: 2021-02-04 | Discharge: 2021-02-04 | Disposition: A | Discharge status: Home / Self Care | Payer: PPO | Source: Ambulatory Visit | Attending: Internal Medicine | Admitting: Internal Medicine

## 2021-02-04 ENCOUNTER — Other Ambulatory Visit: Payer: Self-pay

## 2021-02-04 DIAGNOSIS — Z780 Asymptomatic menopausal state: Secondary | ICD-10-CM | POA: Diagnosis not present

## 2021-02-04 DIAGNOSIS — M810 Age-related osteoporosis without current pathological fracture: Secondary | ICD-10-CM | POA: Diagnosis not present

## 2021-02-05 ENCOUNTER — Other Ambulatory Visit: Payer: Self-pay | Admitting: Internal Medicine

## 2021-02-05 DIAGNOSIS — M810 Age-related osteoporosis without current pathological fracture: Secondary | ICD-10-CM | POA: Insufficient documentation

## 2021-02-05 MED ORDER — ALENDRONATE SODIUM 70 MG PO TABS: 70.0000 mg | ORAL_TABLET | ORAL | 11 refills | Status: DC

## 2021-02-22 ENCOUNTER — Telehealth: Payer: Self-pay

## 2021-02-22 ENCOUNTER — Other Ambulatory Visit: Payer: Self-pay | Admitting: *Deleted

## 2021-02-22 DIAGNOSIS — R002 Palpitations: Secondary | ICD-10-CM

## 2021-02-22 NOTE — Telephone Encounter (Signed)
Referral entered for pt

## 2021-02-22 NOTE — Telephone Encounter (Signed)
When patient was in last with Posey Pronto 01/27/21 he was going to refer patient to cardio and the referral was never entered can you enter this and I will send it it was for heart palpitations

## 2021-03-02 ENCOUNTER — Encounter: Payer: Self-pay | Admitting: Cardiology

## 2021-03-02 ENCOUNTER — Other Ambulatory Visit: Payer: Self-pay | Admitting: Cardiology

## 2021-03-02 ENCOUNTER — Ambulatory Visit (INDEPENDENT_AMBULATORY_CARE_PROVIDER_SITE_OTHER): Payer: PPO

## 2021-03-02 ENCOUNTER — Ambulatory Visit: Payer: PPO | Admitting: Cardiology

## 2021-03-02 VITALS — BP 122/72 | HR 88 | Ht 67.0 in | Wt 119.0 lb

## 2021-03-02 DIAGNOSIS — R002 Palpitations: Secondary | ICD-10-CM

## 2021-03-02 DIAGNOSIS — E782 Mixed hyperlipidemia: Secondary | ICD-10-CM

## 2021-03-02 NOTE — Progress Notes (Signed)
Cardiology Office Note  Date: 03/02/2021   ID: Ruth Jennings, DOB 09-16-1951, MRN [id number]  PCP:  Lindell Spar, MD  Cardiologist:  Rozann Lesches, MD Electrophysiologist:  None   Chief Complaint  Patient presents with  . Palpitations    History of Present Illness: Ruth Jennings is a 70 y.o. female cardiology consultation by Dr. Posey Pronto for the evaluation of palpitations.  She states that since November of last year she has felt a sense of rapid heartbeat, points to her left lateral thorax.  States that this is present most of the time, but particularly bothersome at nighttime when she is still.  She notices it less when she is active during the daytime.  No associated chest pain or syncope.  She was evaluated at [facility] in December 2021 for palpitations, ECG reportedly without any acute findings, I cannot review the tracing.  TSH normal range at 1.018 at that time as well.  She also underwent CT imaging which is noted below, no acute findings.  I personally reviewed her ECG today which shows sinus arrhythmia.  She did take atenolol for treatment of palpitations and states that this was not helpful.   Past Medical History:  Diagnosis Date  . Anxiety   . Hyperlipidemia   . Hypothyroidism   . Posttraumatic stress disorder 03/01/2013    Past Surgical History:  Procedure Laterality Date  . No prior surgery      Current Outpatient Medications  Medication Sig Dispense Refill  . alendronate (FOSAMAX) 70 MG tablet Take 1 tablet (70 mg total) by mouth every 7 (seven) days. Take with a full glass of water on an empty stomach. 4 tablet 11  . ALPRAZolam (XANAX) 1 MG tablet Take 1 mg by mouth at bedtime as needed for sleep.    . Cholecalciferol 25 MCG (1000 UT) tablet Take 2 tablets by mouth daily.    . clobetasol (TEMOVATE) 0.05 % external solution Apply 1 application topically 2 (two) times daily. 50 mL 0  . cyanocobalamin 1000 MCG tablet Take by mouth.     . diclofenac Sodium (VOLTAREN) 1 % GEL Apply to affected finger 3 times a day as needed for arthritis pain    . levothyroxine (SYNTHROID, LEVOTHROID) 75 MCG tablet Take 75 mcg by mouth daily before breakfast.    . pravastatin (PRAVACHOL) 40 MG tablet Take 40 mg by mouth daily.     No current facility-administered medications for this visit.   Allergies:  Prednisone   Social History: The patient  reports that she has never smoked. She has never used smokeless tobacco. She reports that she does not drink alcohol and does not use drugs.   Family History: The patient's family history includes COPD in her father.   ROS: No orthopnea or PND.  Physical Exam: VS:  BP 122/72   Pulse 88   Ht 5\' 7"  (1.702 m)   Wt 119 lb (54 kg)   SpO2 98%   BMI 18.64 kg/m , BMI Body mass index is 18.64 kg/m.  Wt Readings from Last 3 Encounters:  03/02/21 119 lb (54 kg)  01/27/21 117 lb 6.4 oz (53.3 kg)  11/30/15 138 lb (62.6 kg)    General: Patient appears comfortable at rest. HEENT: Conjunctiva and lids normal, wearing a mask. Neck: Supple, no elevated JVP or carotid bruits, no thyromegaly. Lungs: Clear to auscultation, nonlabored breathing at rest. Cardiac: Regular rate and rhythm, no S3 or significant systolic murmur, no  pericardial rub. Abdomen: Soft, nontender, bowel sounds present. Extremities: No pitting edema, distal pulses 2+. Skin: Warm and dry. Musculoskeletal: No kyphosis. Neuropsychiatric: Alert and oriented x3, affect grossly appropriate.  ECG:  No prior tracings available for review today.  Recent Labwork: 01/27/2021: BUN 21; Creatinine, Ser 1.16; Potassium 4.6; Sodium 141; TSH 2.500   Other Studies Reviewed Today:  Chest CT 12/02/2020 [facility]);  FINDINGS:   Thoracic inlet/central airways: Thyroid normal. Airway patent.  Mediastinum/hila/axilla: No adenopathy.  Heart/vessels: Normal heart size. No pericardial effusion. Aorta normal in caliber and appearance. No central pulmonary  embolism.  Lungs/pleura: No pulmonary parenchymal disease, pleural effusion, or pneumothorax.  Upper abdomen: Left upper quadrant diverticulum origin extending from a loop of bowel that is not completely imaged but consistent with distal duodenum or proximal jejunum when correlated with prior CT abdomen/pelvis. This measures 5.8 x 5.6 cm on today's exam, previously 6.2 x 5.0 cm. Cholelithiasis. 1.6 cm left upper pole exophytic renal cystic lesion that has attenuation values approximately 10 Hounsfield units (series 3, image 117), unchanged from prior and probably a benign cyst.  Chest wall/MSK: Within normal limits. No acute rib fractures identified.   CONCLUSION:   1. No acute rib fractures identified. No acute cardiopulmonary abnormality.  2. Redemonstrated large probable diverticulum arising from the distal duodenum or proximal jejunum.  Assessment and Plan:  1.  Palpitations as discussed above.  No associated chest pain or syncope.  ECG shows sinus arrhythmia (normal variant).  Plan to obtain a 72-hour Zio patch to better investigate heart rate variability and exclude arrhythmia.  2.  Hypothyroidism, on Synthroid.  Last TSH normal at 2.5 in March.  3.  Mixed hyperlipidemia, on Pravachol.  Medication Adjustments/Labs and Tests Ordered: Current medicines are reviewed at length with the patient today.  Concerns regarding medicines are outlined above.   Tests Ordered: Orders Placed This Encounter  Procedures  . EKG 12-Lead    Medication Changes: No orders of the defined types were placed in this encounter.   Disposition:  Follow up test results.  Signed, Satira Sark, MD, [facility] 03/02/2021 11:04 AM    [facility] [address], [place], [place] [address] Phone: [phone]; Fax: [phone]

## 2021-03-02 NOTE — Patient Instructions (Signed)
Medication Instructions:  ?Your physician recommends that you continue on your current medications as directed. Please refer to the Current Medication list given to you today. ? ?Labwork: ?none ? ?Testing/Procedures: ?ZIO- Long Term Monitor Instructions  ? ?Your physician has requested you wear your ZIO patch monitor 3 days.  ? ?This is a single patch monitor.  [employer] supplies one patch monitor per enrollment.  Additional stickers are not available. ?  ?Please do not apply patch if you will be having a Nuclear Stress Test, Echocardiogram, Cardiac CT, MRI, or Chest Xray during the time frame you would be wearing the monitor. The patch cannot be worn during these tests.  You cannot remove and re-apply the ZIO XT patch monitor. ?  ?  ?Once you have received you monitor, please review enclosed instructions.  Your monitor has already been registered assigning a specific monitor serial # to you. ? ? ?Applying the monitor  ? ?Shave hair from upper left chest. ?  ?Hold abrader disc by orange tab.  Rub abrader in 40 strokes over left upper chest as indicated in your monitor instructions. ?  ?Clean area with 4 enclosed alcohol pads .  Use all pads to assure are is cleaned thoroughly.  Let dry.  ? ?Apply patch as indicated in monitor instructions.  Patch will be place under collarbone on left side of chest with arrow pointing upward. ?  ?Rub patch adhesive wings for 2 minutes.Remove white label marked "1".  Remove white label marked "2".  Rub patch adhesive wings for 2 additional minutes. ?  ?While looking in a mirror, press and release button in center of patch.  A small green light will flash 3-4 times .  This will be your only indicator the monitor has been turned on. ?    ?Do not shower for the first 24 hours.  You may shower after the first 24 hours. ?  ?Press button if you feel a symptom. You will hear a small click.  Record Date, Time and Symptom in the Patient Log Book. ?  ?When you are ready to remove patch, follow  instructions on last 2 pages of Patient Log Book.  Stick patch monitor onto last page of Patient Log Book. ?  ?Place Patient Log Book in Blue box.  Use locking tab on box and tape box closed securely.  The Orange and White box has prepaid postage on it.  Please place in mailbox as soon as possible.  Your physician should have your test results approximately 7 days after the monitor has been mailed back to [employer]. ?  ?Call [employer] Customer Care at [phone] if you have questions regarding your ZIO XT patch monitor.  Call them immediately if you see an orange light blinking on your monitor. ?  ?If your monitor falls off in less than 4 days contact our Monitor department at [phone].  If your monitor becomes loose or falls off after 4 days call [employer] at [phone] for suggestions on securing your monitor. ? ?Follow-Up: ?Your physician recommends that you schedule a follow-up appointment in: pending ? ?Any Other Special Instructions Will Be Listed Below (If Applicable). ? ?If you need a refill on your cardiac medications before your next appointment, please call your pharmacy. ?

## 2021-03-05 DIAGNOSIS — R002 Palpitations: Secondary | ICD-10-CM | POA: Diagnosis not present

## 2021-03-11 DIAGNOSIS — R002 Palpitations: Secondary | ICD-10-CM | POA: Diagnosis not present

## 2021-03-23 ENCOUNTER — Other Ambulatory Visit: Payer: Self-pay

## 2021-03-23 ENCOUNTER — Encounter: Payer: Self-pay | Admitting: Internal Medicine

## 2021-03-23 ENCOUNTER — Ambulatory Visit (INDEPENDENT_AMBULATORY_CARE_PROVIDER_SITE_OTHER): Payer: PPO | Admitting: Internal Medicine

## 2021-03-23 VITALS — BP 112/71 | HR 75 | Temp 98.2°F | Resp 18 | Ht 67.0 in | Wt 120.0 lb

## 2021-03-23 DIAGNOSIS — R1032 Left lower quadrant pain: Secondary | ICD-10-CM

## 2021-03-23 DIAGNOSIS — G8929 Other chronic pain: Secondary | ICD-10-CM

## 2021-03-23 DIAGNOSIS — R0789 Other chest pain: Secondary | ICD-10-CM

## 2021-03-23 MED ORDER — GABAPENTIN 300 MG PO CAPS: 300.0000 mg | ORAL_CAPSULE | Freq: Every day | ORAL | 1 refills | Status: DC

## 2021-03-23 NOTE — Patient Instructions (Signed)
Please start taking Gabapentin as prescribed for back and left sided pain.  Please consider getting Shingles vaccine.

## 2021-03-23 NOTE — Progress Notes (Signed)
Acute Office Visit  Subjective:    Patient ID: Ruth Jennings, female    DOB: 05-23-1951, 70 y.o.   MRN: [id number]  Chief Complaint  Patient presents with  . Follow-up    Pt has been having the left side pain since November got ct scan done    HPI Patient is in today for evaluation of left sided abdominal and chest wall pain, which is associated with palpitations felt in the left axillary area. She has had CT chest and abdomen done, which were unremarkable before she started to see [place]. She denies any recent injury or heavy lifting. Pain is present at rest as well. Denies any relation with food intake. Denies noticing any rash. But she reports she has h/o chickenpox, but denies h/o Shingles.  Past Medical History:  Diagnosis Date  . Anxiety   . Hyperlipidemia   . Hypothyroidism   . Posttraumatic stress disorder 03/01/2013    Past Surgical History:  Procedure Laterality Date  . No prior surgery      Family History  Problem Relation Age of Onset  . COPD Father     Social History   Socioeconomic History  . Marital status: Single    Spouse name: Not on file  . Number of children: 1  . Years of education: HS  . Highest education level: Not on file  Occupational History  . Occupation: Occupational hygienist: OTHER    Comment: OWN/EMPLOYED AT [employer]  Tobacco Use  . Smoking status: Never Smoker  . Smokeless tobacco: Never Used  Vaping Use  . Vaping Use: Never used  Substance and Sexual Activity  . Alcohol use: No  . Drug use: No  . Sexual activity: Not on file  Other Topics Concern  . Not on file  Social History Narrative  . Not on file   Social Determinants of Health   Financial Resource Strain: Not on file  Food Insecurity: Not on file  Transportation Needs: Not on file  Physical Activity: Not on file  Stress: Not on file  Social Connections: Not on file  Intimate Partner Violence: Not on file    Outpatient Medications Prior to Visit   Medication Sig Dispense Refill  . alendronate (FOSAMAX) 70 MG tablet Take 1 tablet (70 mg total) by mouth every 7 (seven) days. Take with a full glass of water on an empty stomach. 4 tablet 11  . ALPRAZolam (XANAX) 1 MG tablet Take 1 mg by mouth at bedtime as needed for sleep.    . Cholecalciferol 25 MCG (1000 UT) tablet Take 2 tablets by mouth daily.    . clobetasol (TEMOVATE) 0.05 % external solution Apply 1 application topically 2 (two) times daily. 50 mL 0  . cyanocobalamin 1000 MCG tablet Take by mouth.    . diclofenac Sodium (VOLTAREN) 1 % GEL Apply to affected finger 3 times a day as needed for arthritis pain    . levothyroxine (SYNTHROID, LEVOTHROID) 75 MCG tablet Take 75 mcg by mouth daily before breakfast.    . pravastatin (PRAVACHOL) 40 MG tablet Take 40 mg by mouth daily.     No facility-administered medications prior to visit.    Allergies  Allergen Reactions  . Prednisone Other (See Comments)    Caused pain to be worse.    Review of Systems  Constitutional: Negative for chills and fever.  HENT: Negative for congestion, sinus pressure, sinus pain and sore throat.   Eyes: Negative for pain and  discharge.  Respiratory: Negative for cough and shortness of breath.   Cardiovascular: Positive for palpitations. Negative for leg swelling.  Gastrointestinal: Positive for abdominal pain. Negative for constipation, diarrhea, nausea and vomiting.  Endocrine: Negative for polydipsia and polyuria.  Genitourinary: Negative for dysuria and hematuria.  Musculoskeletal: Negative for neck pain and neck stiffness.  Skin: Negative for rash.  Neurological: Negative for dizziness and weakness.  Psychiatric/Behavioral: Negative for agitation and behavioral problems.       Objective:    Physical Exam Vitals reviewed.  Constitutional:      General: She is not in acute distress.    Appearance: She is not diaphoretic.  HENT:     Head: Normocephalic and atraumatic.     Nose: Nose  normal.     Mouth/Throat:     Mouth: Mucous membranes are moist.  Eyes:     General: No scleral icterus.    Extraocular Movements: Extraocular movements intact.  Cardiovascular:     Rate and Rhythm: Normal rate and regular rhythm.     Pulses: Normal pulses.     Heart sounds: Normal heart sounds. No murmur heard.   Pulmonary:     Breath sounds: Normal breath sounds. No wheezing or rales.  Abdominal:     Palpations: Abdomen is soft.     Tenderness: There is no abdominal tenderness.  Musculoskeletal:     Cervical back: Neck supple. No tenderness.     Right lower leg: No edema.     Left lower leg: No edema.  Skin:    General: Skin is warm.     Findings: No rash.  Neurological:     General: No focal deficit present.     Mental Status: She is alert and oriented to person, place, and time.  Psychiatric:        Mood and Affect: Mood normal.        Behavior: Behavior normal.     BP 112/71 (BP Location: Left Arm, Patient Position: Sitting, Cuff Size: Normal)   Pulse 75   Temp 98.2 F (36.8 C) (Oral)   Resp 18   Ht $R'5\' 7"'tm$  (1.702 m)   Wt 120 lb (54.4 kg)   SpO2 97%   BMI 18.79 kg/m  Wt Readings from Last 3 Encounters:  03/23/21 120 lb (54.4 kg)  03/02/21 119 lb (54 kg)  01/27/21 117 lb 6.4 oz (53.3 kg)    Health Maintenance Due  Topic Date Due  . Hepatitis C Screening  Never done  . TETANUS/TDAP  Never done  . COLONOSCOPY (Pts 45-11yrs Insurance coverage will need to be confirmed)  Never done  . COVID-19 Vaccine (4 - Booster for Moderna series) 01/19/2021    There are no preventive care reminders to display for this patient.   Lab Results  Component Value Date   TSH 2.500 01/27/2021   Lab Results  Component Value Date   WBC 6.0 08/20/2010   HGB 14.3 08/20/2010   HCT 43.9 08/20/2010   MCV 88.9 08/20/2010   PLT 213 08/20/2010   Lab Results  Component Value Date   NA 141 01/27/2021   K 4.6 01/27/2021   CO2 24 01/27/2021   GLUCOSE 105 (H) 01/27/2021   BUN  21 01/27/2021   CREATININE 1.16 (H) 01/27/2021   BILITOT 0.6 08/20/2010   ALKPHOS 98 08/20/2010   AST 27 08/20/2010   ALT 21 08/20/2010   PROT 7.0 08/20/2010   ALBUMIN 4.3 08/20/2010   CALCIUM 9.9 01/27/2021   EGFR 51 (  L) 01/27/2021   No results found for: CHOL No results found for: HDL No results found for: LDLCALC No results found for: TRIG No results found for: CHOLHDL No results found for: HGBA1C     Assessment & Plan:   Problem List Items Addressed This Visit      Other   Left-sided chest wall pain - Primary Unclear etiology Previous imaging studies were insignificant Could be postherpetic neuralgia although she does not recall h/o Shingles Will give trial of Gabapentin   Relevant Medications   gabapentin (NEURONTIN) 300 MG capsule    Other Visit Diagnoses    Chronic LLQ pain     Unclear etiology As mentioned above could be a sign of postherpetic neuralgia as no clear etiology Trial of Gabapentin   Relevant Medications   gabapentin (NEURONTIN) 300 MG capsule       Meds ordered this encounter  Medications  . gabapentin (NEURONTIN) 300 MG capsule    Sig: Take 1 capsule (300 mg total) by mouth at bedtime.    Dispense:  30 capsule    Refill:  1     Jode Lippe Keith Rake, MD

## 2021-03-31 ENCOUNTER — Other Ambulatory Visit: Payer: Self-pay | Admitting: Internal Medicine

## 2021-03-31 ENCOUNTER — Telehealth: Payer: Self-pay

## 2021-03-31 DIAGNOSIS — G8929 Other chronic pain: Secondary | ICD-10-CM

## 2021-03-31 DIAGNOSIS — R0789 Other chest pain: Secondary | ICD-10-CM

## 2021-03-31 DIAGNOSIS — R1032 Left lower quadrant pain: Secondary | ICD-10-CM

## 2021-03-31 MED ORDER — GABAPENTIN 300 MG PO CAPS: 300.0000 mg | ORAL_CAPSULE | Freq: Two times a day (BID) | ORAL | 1 refills | Status: DC

## 2021-03-31 NOTE — Telephone Encounter (Signed)
Patient called asking if she can take 1 Gabapentine at night and 1 during the day.  Patient call back # [phone].

## 2021-03-31 NOTE — Telephone Encounter (Signed)
Pt informed

## 2021-03-31 NOTE — Telephone Encounter (Signed)
Yes, she can. Sent a new prescription with twice daily dosing. She can complete her current supplies for now.

## 2021-04-27 ENCOUNTER — Other Ambulatory Visit (HOSPITAL_COMMUNITY): Payer: Self-pay | Admitting: Internal Medicine

## 2021-04-27 DIAGNOSIS — Z1231 Encounter for screening mammogram for malignant neoplasm of breast: Secondary | ICD-10-CM

## 2021-05-12 ENCOUNTER — Telehealth: Payer: Self-pay

## 2021-05-12 NOTE — Telephone Encounter (Signed)
Called patient will keep August appt.

## 2021-05-12 NOTE — Telephone Encounter (Signed)
Patient called said this medicine gabapentin (NEURONTIN) 300 MG capsule Is not helping, she can't sleep. Is there something else she can take that will help with her pain. Call back # [phone].

## 2021-05-12 NOTE — Telephone Encounter (Signed)
Dr patel is out of the office she would need a visit with a provider to discuss changing medication

## 2021-05-17 ENCOUNTER — Other Ambulatory Visit: Payer: Self-pay

## 2021-05-17 ENCOUNTER — Ambulatory Visit (HOSPITAL_COMMUNITY)
Admission: RE | Admit: 2021-05-17 | Discharge: 2021-05-17 | Disposition: A | Discharge status: Home / Self Care | Payer: PPO | Source: Ambulatory Visit | Attending: Internal Medicine | Admitting: Internal Medicine

## 2021-05-17 DIAGNOSIS — Z1231 Encounter for screening mammogram for malignant neoplasm of breast: Secondary | ICD-10-CM

## 2021-06-01 ENCOUNTER — Encounter: Payer: Self-pay | Admitting: Internal Medicine

## 2021-06-01 ENCOUNTER — Other Ambulatory Visit: Payer: Self-pay

## 2021-06-01 ENCOUNTER — Ambulatory Visit (INDEPENDENT_AMBULATORY_CARE_PROVIDER_SITE_OTHER): Payer: PPO | Admitting: Internal Medicine

## 2021-06-01 VITALS — BP 130/78 | HR 75 | Temp 98.0°F | Resp 18 | Ht 67.0 in | Wt 120.4 lb

## 2021-06-01 DIAGNOSIS — M810 Age-related osteoporosis without current pathological fracture: Secondary | ICD-10-CM

## 2021-06-01 DIAGNOSIS — E039 Hypothyroidism, unspecified: Secondary | ICD-10-CM | POA: Diagnosis not present

## 2021-06-01 DIAGNOSIS — R0789 Other chest pain: Secondary | ICD-10-CM

## 2021-06-01 DIAGNOSIS — E785 Hyperlipidemia, unspecified: Secondary | ICD-10-CM

## 2021-06-01 MED ORDER — PREGABALIN 50 MG PO CAPS: 50.0000 mg | ORAL_CAPSULE | Freq: Two times a day (BID) | ORAL | 0 refills | Status: DC

## 2021-06-01 MED ORDER — LEVOTHYROXINE SODIUM 75 MCG PO TABS: 75.0000 ug | ORAL_TABLET | Freq: Every day | ORAL | 5 refills | Status: DC

## 2021-06-01 MED ORDER — PRAVASTATIN SODIUM 40 MG PO TABS: 40.0000 mg | ORAL_TABLET | Freq: Every day | ORAL | 5 refills | Status: DC

## 2021-06-01 NOTE — Patient Instructions (Signed)
Please start taking Lyrica instead of Gabapentin.  You are being referred to Pain clinic for evaluation of neuropathic pain.  Please contact your insurance regarding cost of TDaP vaccine and let us know if you prefer to take it.

## 2021-06-01 NOTE — Assessment & Plan Note (Signed)
On statin.

## 2021-06-01 NOTE — Assessment & Plan Note (Signed)
Intermittent, appears to radiating from LLQ to anterior axillary border Does not seem to cardiac related pain Unclear etiology Previous imaging studies were insignificant including CT chest Started Lyrica instead of Gabapentin Has tried Cymbalta in the past Referred to pain medicine

## 2021-06-01 NOTE — Assessment & Plan Note (Signed)
On Levothyroxine 75 mcg QD Check TSH and free T4 

## 2021-06-01 NOTE — Progress Notes (Signed)
Established Patient Office Visit  Subjective:  Patient ID: Ruth Jennings, female    DOB: 1951/10/05  Age: 70 y.o. MRN: [id number]  CC:  Chief Complaint  Patient presents with   Pain    Pt is still having left side pain and palpatations in back since last November the gabapentin did not work     HPI Ruth Jennings is a 70 year old female with PMH of hypothyroidism, CKD stage 3a, anxiety and HLD who  presents for follow up of her chronic medical conditions.  Left sided chest wall pain: Chronic, intermittent, worse at night radiating from LLQ abdomen. Associated with palpitations. Has tried Cymbalta and Gabapentin. Has had CT chest and Cardiology evaluation.  Hypothyroidism: Takes Levothyroxine 75 mcg QD. Denies any constipation, diarrhea, recent change in appetite or weight.  Anxiety: Well-controlled with Xanax PRN. Sleep is affected by chest wall pain. Denies any anhedonia, SI or HI.  Past Medical History:  Diagnosis Date   Anxiety    Hyperlipidemia    Hypothyroidism    Posttraumatic stress disorder 03/01/2013    Past Surgical History:  Procedure Laterality Date   No prior surgery      Family History  Problem Relation Age of Onset   COPD Father     Social History   Socioeconomic History   Marital status: Single    Spouse name: Not on file   Number of children: 1   Years of education: HS   Highest education level: Not on file  Occupational History   Occupation: Occupational hygienist: OTHER    Comment: OWN/EMPLOYED AT [employer]  Tobacco Use   Smoking status: Never   Smokeless tobacco: Never  Vaping Use   Vaping Use: Never used  Substance and Sexual Activity   Alcohol use: No   Drug use: No   Sexual activity: Not on file  Other Topics Concern   Not on file  Social History Narrative   Not on file   Social Determinants of Health   Financial Resource Strain: Not on file  Food Insecurity: Not on file  Transportation Needs: Not on file   Physical Activity: Not on file  Stress: Not on file  Social Connections: Not on file  Intimate Partner Violence: Not on file    Outpatient Medications Prior to Visit  Medication Sig Dispense Refill   alendronate (FOSAMAX) 70 MG tablet Take 1 tablet (70 mg total) by mouth every 7 (seven) days. Take with a full glass of water on an empty stomach. 4 tablet 11   ALPRAZolam (XANAX) 1 MG tablet Take 1 mg by mouth at bedtime as needed for sleep.     Cholecalciferol 25 MCG (1000 UT) tablet Take 2 tablets by mouth daily.     clobetasol (TEMOVATE) 0.05 % external solution Apply 1 application topically 2 (two) times daily. 50 mL 0   cyanocobalamin 1000 MCG tablet Take by mouth.     diclofenac Sodium (VOLTAREN) 1 % GEL Apply to affected finger 3 times a day as needed for arthritis pain     levothyroxine (SYNTHROID, LEVOTHROID) 75 MCG tablet Take 75 mcg by mouth daily before breakfast.     pravastatin (PRAVACHOL) 40 MG tablet Take 40 mg by mouth daily.     gabapentin (NEURONTIN) 300 MG capsule Take 1 capsule (300 mg total) by mouth 2 (two) times daily. (Patient not taking: Reported on 06/01/2021) 60 capsule 1   No facility-administered medications prior to visit.    Allergies  Allergen Reactions   Prednisone Other (See Comments)    Caused pain to be worse.    ROS Review of Systems  Constitutional:  Negative for chills and fever.  HENT:  Negative for congestion, sinus pressure, sinus pain and sore throat.   Eyes:  Negative for pain and discharge.  Respiratory:  Negative for cough and shortness of breath.   Cardiovascular:  Positive for palpitations. Negative for leg swelling.  Gastrointestinal:  Positive for abdominal pain. Negative for constipation, diarrhea, nausea and vomiting.  Endocrine: Negative for polydipsia and polyuria.  Genitourinary:  Negative for dysuria and hematuria.  Musculoskeletal:  Negative for neck pain and neck stiffness.  Skin:  Negative for rash.  Neurological:   Negative for dizziness and weakness.  Psychiatric/Behavioral:  Negative for agitation and behavioral problems.      Objective:    Physical Exam Vitals reviewed.  Constitutional:      General: She is not in acute distress.    Appearance: She is not diaphoretic.  HENT:     Head: Normocephalic and atraumatic.     Nose: Nose normal.     Mouth/Throat:     Mouth: Mucous membranes are moist.  Eyes:     General: No scleral icterus.    Extraocular Movements: Extraocular movements intact.  Cardiovascular:     Rate and Rhythm: Normal rate and regular rhythm.     Pulses: Normal pulses.     Heart sounds: Normal heart sounds. No murmur heard. Pulmonary:     Breath sounds: Normal breath sounds. No wheezing or rales.  Abdominal:     Palpations: Abdomen is soft.     Tenderness: There is no abdominal tenderness.  Musculoskeletal:     Cervical back: Neck supple. No tenderness.     Right lower leg: No edema.     Left lower leg: No edema.  Skin:    General: Skin is warm.     Findings: No rash.  Neurological:     General: No focal deficit present.     Mental Status: She is alert and oriented to person, place, and time.  Psychiatric:        Mood and Affect: Mood normal.        Behavior: Behavior normal.    BP 130/78 (BP Location: Left Arm, Patient Position: Sitting, Cuff Size: Normal)   Pulse 75   Temp 98 F (36.7 C) (Oral)   Resp 18   Ht $R'5\' 7"'zr$  (1.702 m)   Wt 120 lb 6.4 oz (54.6 kg)   SpO2 96%   BMI 18.86 kg/m  Wt Readings from Last 3 Encounters:  06/01/21 120 lb 6.4 oz (54.6 kg)  03/23/21 120 lb (54.4 kg)  03/02/21 119 lb (54 kg)     Health Maintenance Due  Topic Date Due   Hepatitis C Screening  Never done   TETANUS/TDAP  Never done   COLONOSCOPY (Pts 45-84yrs Insurance coverage will need to be confirmed)  Never done   COVID-19 Vaccine (4 - Booster for Moderna series) 01/19/2021   Zoster Vaccines- Shingrix (2 of 2) 05/18/2021   INFLUENZA VACCINE  05/31/2021    There  are no preventive care reminders to display for this patient.  Lab Results  Component Value Date   TSH 2.500 01/27/2021   Lab Results  Component Value Date   WBC 6.0 08/20/2010   HGB 14.3 08/20/2010   HCT 43.9 08/20/2010   MCV 88.9 08/20/2010   PLT 213 08/20/2010   Lab Results  Component  Value Date   NA 141 01/27/2021   K 4.6 01/27/2021   CO2 24 01/27/2021   GLUCOSE 105 (H) 01/27/2021   BUN 21 01/27/2021   CREATININE 1.16 (H) 01/27/2021   BILITOT 0.6 08/20/2010   ALKPHOS 98 08/20/2010   AST 27 08/20/2010   ALT 21 08/20/2010   PROT 7.0 08/20/2010   ALBUMIN 4.3 08/20/2010   CALCIUM 9.9 01/27/2021   EGFR 51 (L) 01/27/2021   No results found for: CHOL No results found for: HDL No results found for: LDLCALC No results found for: TRIG No results found for: CHOLHDL No results found for: HGBA1C    Assessment & Plan:   Problem List Items Addressed This Visit       Endocrine   Acquired hypothyroidism    On Levothyroxine 75 mcg QD Check TSH and free T4       Relevant Medications   levothyroxine (SYNTHROID) 75 MCG tablet   Other Relevant Orders   CBC with Differential/Platelet   CMP14+EGFR   TSH + free T4     Musculoskeletal and Integument   Osteoporosis without current pathological fracture    Started Alendronate Continue calcium and Vitamin D supplements         Other   Hyperlipidemia    On statin       Relevant Medications   pravastatin (PRAVACHOL) 40 MG tablet   Left-sided chest wall pain - Primary    Intermittent, appears to radiating from LLQ to anterior axillary border Does not seem to cardiac related pain Unclear etiology Previous imaging studies were insignificant including CT chest Started Lyrica instead of Gabapentin Has tried Cymbalta in the past Referred to pain medicine       Relevant Medications   pregabalin (LYRICA) 50 MG capsule   Other Relevant Orders   Ambulatory referral to Pain Clinic    Meds ordered this encounter   Medications   pravastatin (PRAVACHOL) 40 MG tablet    Sig: Take 1 tablet (40 mg total) by mouth daily.    Dispense:  30 tablet    Refill:  5   levothyroxine (SYNTHROID) 75 MCG tablet    Sig: Take 1 tablet (75 mcg total) by mouth daily before breakfast.    Dispense:  30 tablet    Refill:  5   pregabalin (LYRICA) 50 MG capsule    Sig: Take 1 capsule (50 mg total) by mouth 2 (two) times daily.    Dispense:  60 capsule    Refill:  0    Follow-up: Return in about 3 months (around 09/01/2021) for Left sided chest wall pain.    Lindell Spar, MD

## 2021-06-01 NOTE — Assessment & Plan Note (Addendum)
Started Alendronate Continue calcium and Vitamin D supplements

## 2021-06-02 ENCOUNTER — Ambulatory Visit: Payer: PPO | Admitting: Internal Medicine

## 2021-06-02 LAB — CBC WITH DIFFERENTIAL/PLATELET
Basophils Absolute: 0 10*3/uL (ref 0.0–0.2)
Basos: 1 %
EOS (ABSOLUTE): 0.1 10*3/uL (ref 0.0–0.4)
Eos: 2 %
Hematocrit: 42.3 % (ref 34.0–46.6)
Hemoglobin: 13.9 g/dL (ref 11.1–15.9)
Immature Grans (Abs): 0 10*3/uL (ref 0.0–0.1)
Immature Granulocytes: 0 %
Lymphocytes Absolute: 1.3 10*3/uL (ref 0.7–3.1)
Lymphs: 24 %
MCH: 29.7 pg (ref 26.6–33.0)
MCHC: 32.9 g/dL (ref 31.5–35.7)
MCV: 90 fL (ref 79–97)
Monocytes Absolute: 0.4 10*3/uL (ref 0.1–0.9)
Monocytes: 7 %
Neutrophils Absolute: 3.8 10*3/uL (ref 1.4–7.0)
Neutrophils: 66 %
Platelets: 195 10*3/uL (ref 150–450)
RBC: 4.68 x10E6/uL (ref 3.77–5.28)
RDW: 13 % (ref 11.7–15.4)
WBC: 5.6 10*3/uL (ref 3.4–10.8)

## 2021-06-02 LAB — CMP14+EGFR
ALT: 14 IU/L (ref 0–32)
AST: 17 IU/L (ref 0–40)
Albumin/Globulin Ratio: 1.7 (ref 1.2–2.2)
Albumin: 4.3 g/dL (ref 3.8–4.8)
Alkaline Phosphatase: 90 IU/L (ref 44–121)
BUN/Creatinine Ratio: 16 (ref 12–28)
BUN: 14 mg/dL (ref 8–27)
Bilirubin Total: 0.4 mg/dL (ref 0.0–1.2)
CO2: 29 mmol/L (ref 20–29)
Calcium: 9.8 mg/dL (ref 8.7–10.3)
Chloride: 101 mmol/L (ref 96–106)
Creatinine, Ser: 0.89 mg/dL (ref 0.57–1.00)
Globulin, Total: 2.5 g/dL (ref 1.5–4.5)
Glucose: 90 mg/dL (ref 65–99)
Potassium: 4.7 mmol/L (ref 3.5–5.2)
Sodium: 141 mmol/L (ref 134–144)
Total Protein: 6.8 g/dL (ref 6.0–8.5)
eGFR: 70 mL/min/{1.73_m2} (ref >59)

## 2021-06-02 LAB — TSH+FREE T4
Free T4: 1.33 ng/dL (ref 0.82–1.77)
TSH: 3.75 u[IU]/mL (ref 0.450–4.500)

## 2021-06-23 DIAGNOSIS — M5451 Vertebrogenic low back pain: Secondary | ICD-10-CM | POA: Diagnosis not present

## 2021-06-23 DIAGNOSIS — F419 Anxiety disorder, unspecified: Secondary | ICD-10-CM | POA: Diagnosis not present

## 2021-06-23 DIAGNOSIS — R0789 Other chest pain: Secondary | ICD-10-CM | POA: Diagnosis not present

## 2021-06-23 DIAGNOSIS — F4310 Post-traumatic stress disorder, unspecified: Secondary | ICD-10-CM | POA: Diagnosis not present

## 2021-07-06 ENCOUNTER — Ambulatory Visit (INDEPENDENT_AMBULATORY_CARE_PROVIDER_SITE_OTHER): Payer: PPO

## 2021-07-06 ENCOUNTER — Ambulatory Visit: Payer: PPO

## 2021-07-06 ENCOUNTER — Other Ambulatory Visit: Payer: Self-pay

## 2021-07-06 DIAGNOSIS — Z0000 Encounter for general adult medical examination without abnormal findings: Secondary | ICD-10-CM

## 2021-07-06 NOTE — Progress Notes (Signed)
Subjective:   Ruth Jennings is a 70 y.o. female who presents for an Initial Medicare Annual Wellness Visit. I connected with  MOYRA LEHNERT on 07/06/21 by a audio enabled telemedicine application and verified that I am speaking with the correct person using two identifiers.   I discussed the limitations of evaluation and management by telemedicine. The patient expressed understanding and agreed to proceed.  Location of patient:Home  Location of Provider:Office  Persons participating in virtual visit: Ruth Jennings (patient) and Valli Glance  Review of Systems    Defer to PCP       Objective:    Today's Vitals   07/06/21 1404  PainSc: 8    There is no height or weight on file to calculate BMI.  Advanced Directives 07/06/2021  Does Patient Have a Medical Advance Directive? Yes  Type of Paramedic of [place];Living will    Current Medications (verified) Outpatient Encounter Medications as of 07/06/2021  Medication Sig   alendronate (FOSAMAX) 70 MG tablet Take 1 tablet (70 mg total) by mouth every 7 (seven) days. Take with a full glass of water on an empty stomach.   ALPRAZolam (XANAX) 1 MG tablet Take 1 mg by mouth at bedtime as needed for sleep.   Cholecalciferol 25 MCG (1000 UT) tablet Take 2 tablets by mouth daily.   cyanocobalamin 1000 MCG tablet Take by mouth.   levothyroxine (SYNTHROID) 75 MCG tablet Take 1 tablet (75 mcg total) by mouth daily before breakfast.   pregabalin (LYRICA) 100 MG capsule Take by mouth 2 (two) times daily.   [DISCONTINUED] pregabalin (LYRICA) 50 MG capsule Take 1 capsule (50 mg total) by mouth 2 (two) times daily.   pravastatin (PRAVACHOL) 40 MG tablet Take 1 tablet (40 mg total) by mouth daily. (Patient not taking: Reported on 07/06/2021)   [DISCONTINUED] clobetasol (TEMOVATE) 0.05 % external solution Apply 1 application topically 2 (two) times daily.   [DISCONTINUED] diclofenac Sodium (VOLTAREN) 1 % GEL Apply to affected  finger 3 times a day as needed for arthritis pain   No facility-administered encounter medications on file as of 07/06/2021.    Allergies (verified) Prednisone   History: Past Medical History:  Diagnosis Date   Anxiety    Hyperlipidemia    Hypothyroidism    Posttraumatic stress disorder 03/01/2013   Past Surgical History:  Procedure Laterality Date   No prior surgery     Family History  Problem Relation Age of Onset   COPD Father    Social History   Socioeconomic History   Marital status: Single    Spouse name: Not on file   Number of children: 1   Years of education: HS   Highest education level: Not on file  Occupational History   Occupation: Occupational hygienist: OTHER    Comment: OWN/EMPLOYED AT [employer]  Tobacco Use   Smoking status: Never   Smokeless tobacco: Never  Vaping Use   Vaping Use: Never used  Substance and Sexual Activity   Alcohol use: No   Drug use: No   Sexual activity: Not Currently  Other Topics Concern   Not on file  Social History Narrative   Not on file   Social Determinants of Health   Financial Resource Strain: Low Risk    Difficulty of Paying Living Expenses: Not hard at all  Food Insecurity: No Food Insecurity   Worried About [place] in the Last Year: Never true   Ran  Out of Food in the Last Year: Never true  Transportation Needs: No Transportation Needs   Lack of Transportation (Medical): No   Lack of Transportation (Non-Medical): No  Physical Activity: Inactive   Days of Exercise per Week: 0 days   Minutes of Exercise per Session: 0 min  Stress: No Stress Concern Present   Feeling of Stress : Not at all  Social Connections: Moderately Integrated   Frequency of Communication with Friends and Family: More than three times a week   Frequency of Social Gatherings with Friends and Family: Once a week   Attends Religious Services: More than 4 times per year   Active Member of [employer] or Organizations:  No   Attends Music therapist: Never   Marital Status: Married    Tobacco Counseling Counseling given: Not Answered   Clinical Intake:  Pre-visit preparation completed: Yes  Pain : 0-10 Pain Score: 8  Pain Location:  (side) Pain Orientation: Left Pain Descriptors / Indicators: Aching Patient has already discussed pain with PCP and has a follow up appointment.     Nutritional Risks: None Diabetes: No  How often do you need to have someone help you when you read instructions, pamphlets, or other written materials from your doctor or pharmacy?: 1 - Never What is the last grade level you completed in school?: High School  Diabetic?No  Interpreter Needed?: No  Information entered by :: Valli Glance   Activities of Daily Living In your present state of health, do you have any difficulty performing the following activities: 07/06/2021 01/27/2021  Hearing? N N  Vision? N N  Difficulty concentrating or making decisions? N N  Walking or climbing stairs? Y N  Dressing or bathing? N N  Doing errands, shopping? Y N  Comment husband drives -  Conservation officer, nature and eating ? N -  Using the Toilet? N -  In the past six months, have you accidently leaked urine? Y -  Do you have problems with loss of bowel control? N -  Managing your Medications? N -  Managing your Finances? N -  Housekeeping or managing your Housekeeping? N -  Some recent data might be hidden    Patient Care Team: Lindell Spar, MD as PCP - General (Internal Medicine) Satira Sark, MD as PCP - Cardiology (Cardiology)  Indicate any recent Medical Services you may have received from other than [facility] providers in the past year (date may be approximate).     Assessment:   This is a routine wellness examination for Ruth Jennings.  Hearing/Vision screen No results found.  Dietary issues and exercise activities discussed: Current Exercise Habits: The patient does not participate in regular  exercise at present   Goals Addressed   None    Depression Screen PHQ 2/9 Scores 07/06/2021 06/01/2021 03/23/2021 01/27/2021  PHQ - 2 Score 0 0 4 0  PHQ- 9 Score - - 9 -    Fall Risk Fall Risk  07/06/2021 06/01/2021 03/23/2021 01/27/2021  Falls in the past year? 0 0 0 0  Number falls in past yr: 0 0 0 0  Injury with Fall? 0 0 0 0  Risk for fall due to : No Fall Risks No Fall Risks No Fall Risks No Fall Risks  Follow up Falls evaluation completed Falls evaluation completed Falls evaluation completed Falls evaluation completed    [place]:  Any stairs in or around the home? Yes  If so, are there any  without handrails? No  Home free of loose throw rugs in walkways, pet beds, electrical cords, etc? Yes  Adequate lighting in your home to reduce risk of falls? Yes   ASSISTIVE DEVICES UTILIZED TO PREVENT FALLS:  Life alert? No  Use of a cane, walker or w/c? No  Grab bars in the bathroom? Yes  Shower chair or bench in shower? No  Elevated toilet seat or a handicapped toilet? No   TIMED UP AND GO:  Was the test performed?  N/A .  Length of time to ambulate 10 feet: N/A sec.     Cognitive Function:     6CIT Screen 07/06/2021  What Year? 0 points  What month? 0 points  What time? 0 points  Count back from 20 0 points  Months in reverse 0 points  Repeat phrase 10 points  Total Score 10    Immunizations Immunization History  Administered Date(s) Administered   Influenza, High Dose Seasonal PF 07/24/2017, 07/31/2019, 08/04/2020   Influenza,inj,quad, With Preservative 07/27/2016, 07/31/2018   Moderna Sars-Covid-2 Vaccination 01/17/2020, 02/14/2020, 10/21/2020   Pneumococcal Conjugate-13 07/27/2016   Pneumococcal Polysaccharide-23 07/24/2017   Zoster Recombinat (Shingrix) 03/23/2021, 06/04/2021    TDAP status: Due, Education has been provided regarding the importance of this vaccine. Advised may receive this vaccine at local pharmacy or Health  Dept. Aware to provide a copy of the vaccination record if obtained from local pharmacy or Health Dept. Verbalized acceptance and understanding.  Flu Vaccine status: Due, Education has been provided regarding the importance of this vaccine. Advised may receive this vaccine at local pharmacy or Health Dept. Aware to provide a copy of the vaccination record if obtained from local pharmacy or Health Dept. Verbalized acceptance and understanding.  Pneumococcal vaccine status: Up to date  Covid-19 vaccine status: Information provided on how to obtain vaccines.   Qualifies for Shingles Vaccine? Yes   Zostavax completed  N/A   Shingrix Completed?: Yes  Screening Tests Health Maintenance  Topic Date Due   Hepatitis C Screening  Never done   TETANUS/TDAP  Never done   COLONOSCOPY (Pts 45-55yr Insurance coverage will need to be confirmed)  Never done   COVID-19 Vaccine (4 - Booster for Moderna series) 01/19/2021   INFLUENZA VACCINE  05/31/2021   MAMMOGRAM  05/18/2023   DEXA SCAN  Completed   PNA vac Low Risk Adult  Completed   Zoster Vaccines- Shingrix  Completed   HPV VACCINES  Aged Out    Health Maintenance  Health Maintenance Due  Topic Date Due   Hepatitis C Screening  Never done   TETANUS/TDAP  Never done   COLONOSCOPY (Pts 45-456yrInsurance coverage will need to be confirmed)  Never done   COVID-19 Vaccine (4 - Booster for Moderna series) 01/19/2021   INFLUENZA VACCINE  05/31/2021    Colorectal Cancer Screening: Patient will discuss options with PCP.   Mammogram status: Completed 05/17/2021. Repeat every year  Bone Density status: Completed 02/04/2021. Results reflect: Bone density results: OSTEOPOROSIS. Repeat every 2 years.  Lung Cancer Screening: (Low Dose CT Chest recommended if Age 70-80ears, 30 pack-year currently smoking OR have quit w/in 15years.) does not qualify.   Lung Cancer Screening Referral: NO  Additional Screening:  Hepatitis C Screening: does qualify;  Not Completed   Vision Screening: Recommended annual ophthalmology exams for early detection of glaucoma and other disorders of the eye. Is the patient up to date with their annual eye exam?  Yes  Who is the provider or  what is the name of the office in which the patient attends annual eye exams? [facility]  If pt is not established with a provider, would they like to be referred to a provider to establish care? No .   Dental Screening: Recommended annual dental exams for proper oral hygiene  Community Resource Referral / Chronic Care Management: CRR required this visit?  No   CCM required this visit?  No      Plan:     I have personally reviewed and noted the following in the patient's chart:   Medical and social history Use of alcohol, tobacco or illicit drugs  Current medications and supplements including opioid prescriptions. Patient is not currently taking opioid prescriptions. Functional ability and status Nutritional status Physical activity Advanced directives List of other physicians Hospitalizations, surgeries, and ER visits in previous 12 months Vitals Screenings to include cognitive, depression, and falls Referrals and appointments  In addition, I have reviewed and discussed with patient certain preventive protocols, quality metrics, and best practice recommendations. A written personalized care plan for preventive services as well as general preventive health recommendations were provided to patient.     Edgar Frisk, [facility]   07/06/2021   Nurse Notes: Non Face to Face 30 minute visit encounter.    Ruth Jennings , Thank you for taking time to come for your Medicare Wellness Visit. I appreciate your ongoing commitment to your health goals. Please review the following plan we discussed and let me know if I can assist you in the future.   These are the goals we discussed:  Goals   None     This is a list of the screening recommended for you and due dates:   Health Maintenance  Topic Date Due   Hepatitis C Screening: USPSTF Recommendation to screen - Ages 55-79 yo.  Never done   Tetanus Vaccine  Never done   Colon Cancer Screening  Never done   COVID-19 Vaccine (4 - Booster for Moderna series) 01/19/2021   Flu Shot  05/31/2021   Mammogram  05/18/2023   DEXA scan (bone density measurement)  Completed   Pneumonia vaccines  Completed   Zoster (Shingles) Vaccine  Completed   HPV Vaccine  Aged Out    Patient advised AVS can be viewed on MyChart.

## 2021-07-06 NOTE — Patient Instructions (Signed)
Health Maintenance, Female Adopting a healthy lifestyle and getting preventive care are important in promoting health and wellness. Ask your health care provider about: The right schedule for you to have regular tests and exams. Things you can do on your own to prevent diseases and keep yourself healthy. What should I know about diet, weight, and exercise? Eat a healthy diet  Eat a diet that includes plenty of vegetables, fruits, low-fat dairy products, and lean protein. Do not eat a lot of foods that are high in solid fats, added sugars, or sodium. Maintain a healthy weight Body mass index (BMI) is used to identify weight problems. It estimates body fat based on height and weight. Your health care provider can help determine your BMI and help you achieve or maintain a healthy weight. Get regular exercise Get regular exercise. This is one of the most important things you can do for your health. Most adults should: Exercise for at least 150 minutes each week. The exercise should increase your heart rate and make you sweat (moderate-intensity exercise). Do strengthening exercises at least twice a week. This is in addition to the moderate-intensity exercise. Spend less time sitting. Even light physical activity can be beneficial. Watch cholesterol and blood lipids Have your blood tested for lipids and cholesterol at 70 years of age, then have this test every 5 years. Have your cholesterol levels checked more often if: Your lipid or cholesterol levels are high. You are older than 70 years of age. You are at high risk for heart disease. What should I know about cancer screening? Depending on your health history and family history, you may need to have cancer screening at various ages. This may include screening for: Breast cancer. Cervical cancer. Colorectal cancer. Skin cancer. Lung cancer. What should I know about heart disease, diabetes, and high blood pressure? Blood pressure and heart  disease High blood pressure causes heart disease and increases the risk of stroke. This is more likely to develop in people who have high blood pressure readings, are of African descent, or are overweight. Have your blood pressure checked: Every 3-5 years if you are 18-39 years of age. Every year if you are 40 years old or older. Diabetes Have regular diabetes screenings. This checks your fasting blood sugar level. Have the screening done: Once every three years after age 40 if you are at a normal weight and have a low risk for diabetes. More often and at a younger age if you are overweight or have a high risk for diabetes. What should I know about preventing infection? Hepatitis B If you have a higher risk for hepatitis B, you should be screened for this virus. Talk with your health care provider to find out if you are at risk for hepatitis B infection. Hepatitis C Testing is recommended for: Everyone born from 1945 through 1965. Anyone with known risk factors for hepatitis C. Sexually transmitted infections (STIs) Get screened for STIs, including gonorrhea and chlamydia, if: You are sexually active and are younger than 70 years of age. You are older than 70 years of age and your health care provider tells you that you are at risk for this type of infection. Your sexual activity has changed since you were last screened, and you are at increased risk for chlamydia or gonorrhea. Ask your health care provider if you are at risk. Ask your health care provider about whether you are at high risk for HIV. Your health care provider may recommend a prescription medicine   to help prevent HIV infection. If you choose to take medicine to prevent HIV, you should first get tested for HIV. You should then be tested every 3 months for as long as you are taking the medicine. Pregnancy If you are about to stop having your period (premenopausal) and you may become pregnant, seek counseling before you get  pregnant. Take 400 to 800 micrograms (mcg) of folic acid every day if you become pregnant. Ask for birth control (contraception) if you want to prevent pregnancy. Osteoporosis and menopause Osteoporosis is a disease in which the bones lose minerals and strength with aging. This can result in bone fractures. If you are 65 years old or older, or if you are at risk for osteoporosis and fractures, ask your health care provider if you should: Be screened for bone loss. Take a calcium or vitamin D supplement to lower your risk of fractures. Be given hormone replacement therapy (HRT) to treat symptoms of menopause. Follow these instructions at home: Lifestyle Do not use any products that contain nicotine or tobacco, such as cigarettes, e-cigarettes, and chewing tobacco. If you need help quitting, ask your health care provider. Do not use street drugs. Do not share needles. Ask your health care provider for help if you need support or information about quitting drugs. Alcohol use Do not drink alcohol if: Your health care provider tells you not to drink. You are pregnant, may be pregnant, or are planning to become pregnant. If you drink alcohol: Limit how much you use to 0-1 drink a day. Limit intake if you are breastfeeding. Be aware of how much alcohol is in your drink. In the [place] one drink equals one 12 oz bottle of beer (355 mL), one 5 oz glass of wine (148 mL), or one 1 oz glass of hard liquor (44 mL). General instructions Schedule regular health, dental, and eye exams. Stay current with your vaccines. Tell your health care provider if: You often feel depressed. You have ever been abused or do not feel safe at home. Summary Adopting a healthy lifestyle and getting preventive care are important in promoting health and wellness. Follow your health care provider's instructions about healthy diet, exercising, and getting tested or screened for diseases. Follow your health care provider's  instructions on monitoring your cholesterol and blood pressure. This information is not intended to replace advice given to you by your health care provider. Make sure you discuss any questions you have with your health care provider. Document Revised: 12/25/2020 Document Reviewed: 10/10/2018 Elsevier Patient Education  2022 Elsevier Inc.  

## 2021-07-07 ENCOUNTER — Ambulatory Visit (INDEPENDENT_AMBULATORY_CARE_PROVIDER_SITE_OTHER): Payer: PPO

## 2021-07-07 ENCOUNTER — Other Ambulatory Visit: Payer: Self-pay

## 2021-07-07 DIAGNOSIS — Z23 Encounter for immunization: Secondary | ICD-10-CM

## 2021-09-01 ENCOUNTER — Encounter: Payer: Self-pay | Admitting: Internal Medicine

## 2021-09-01 ENCOUNTER — Other Ambulatory Visit: Payer: Self-pay

## 2021-09-01 ENCOUNTER — Ambulatory Visit (INDEPENDENT_AMBULATORY_CARE_PROVIDER_SITE_OTHER): Payer: PPO | Admitting: Internal Medicine

## 2021-09-01 VITALS — BP 121/72 | HR 80 | Temp 97.9°F | Resp 16 | Ht 67.0 in | Wt 122.1 lb

## 2021-09-01 DIAGNOSIS — F411 Generalized anxiety disorder: Secondary | ICD-10-CM | POA: Insufficient documentation

## 2021-09-01 DIAGNOSIS — E039 Hypothyroidism, unspecified: Secondary | ICD-10-CM

## 2021-09-01 DIAGNOSIS — G8929 Other chronic pain: Secondary | ICD-10-CM

## 2021-09-01 DIAGNOSIS — G4700 Insomnia, unspecified: Secondary | ICD-10-CM

## 2021-09-01 DIAGNOSIS — R0789 Other chest pain: Secondary | ICD-10-CM

## 2021-09-01 DIAGNOSIS — G5601 Carpal tunnel syndrome, right upper limb: Secondary | ICD-10-CM | POA: Insufficient documentation

## 2021-09-01 DIAGNOSIS — M546 Pain in thoracic spine: Secondary | ICD-10-CM | POA: Diagnosis not present

## 2021-09-01 DIAGNOSIS — M810 Age-related osteoporosis without current pathological fracture: Secondary | ICD-10-CM

## 2021-09-01 MED ORDER — ALPRAZOLAM 1 MG PO TABS
1.0000 mg | ORAL_TABLET | Freq: Two times a day (BID) | ORAL | 0 refills | Status: DC | PRN
Start: 2021-09-01 — End: 2021-12-29

## 2021-09-01 MED ORDER — CYCLOBENZAPRINE HCL 5 MG PO TABS: 5.0000 mg | ORAL_TABLET | Freq: Three times a day (TID) | ORAL | 1 refills | Status: DC | PRN

## 2021-09-01 NOTE — Assessment & Plan Note (Signed)
Has right thumb numbness and tingling Could be a sign of carpal tunnel syndrome Advised to wear wrist brace

## 2021-09-01 NOTE — Assessment & Plan Note (Signed)
Intermittent, appears to radiating from LLQ to anterior axillary border Does not seem to cardiac related pain Unclear etiology Previous imaging studies were insignificant including CT chest Started Lyrica instead of Gabapentin Has tried Cymbalta in the past Followed by pain medicine

## 2021-09-01 NOTE — Patient Instructions (Signed)
Please take Flexeril for back spasms. Okay to take Tylenol as needed for back pain. Okay to apply heating pad or back brace.  Please continue to take other medications as prescribed.

## 2021-09-01 NOTE — Assessment & Plan Note (Signed)
Could be contributing to radiating pain to left flank/abdominal area Avoid heavy lifting Tylenol PRN Heating pad or back brace PRN Flexeril PRN for muscle spasms

## 2021-09-01 NOTE — Assessment & Plan Note (Signed)
Lab Results  Component Value Date   TSH 3.750 06/01/2021   On Levothyroxine 75 mcg QD

## 2021-09-01 NOTE — Assessment & Plan Note (Signed)
Well-controlled with Xanax 1 mg PRN, takes it BID sometimes

## 2021-09-01 NOTE — Progress Notes (Signed)
Established Patient Office Visit  Subjective:  Patient ID: Ruth Jennings, female    DOB: 01-Feb-1951  Age: 70 y.o. MRN: [id number]  CC:  Chief Complaint  Patient presents with   Follow-up    3 month follow up pt is still having palpitations back and side pain also having right thumb pain for about a month and has noticed a difference in her left breast about 2 weeks ago     HPI Ruth Jennings is a 70 y.o. female with past medical history of hypothyroidism, CKD stage 3a, anxiety and HLD who  presents for follow up of her chronic medical conditions.   Left sided chest wall pain: Chronic, intermittent, worse at night radiating from LLQ abdomen. Associated with palpitations. Has tried Cymbalta and Gabapentin. Has had CT chest and Cardiology evaluation. She has been seeing pain clinic, and has been tolerating Lyrica well.   Hypothyroidism: Takes Levothyroxine 75 mcg QD. Denies any constipation, diarrhea, recent change in appetite or weight.   Anxiety: Well-controlled with Xanax PRN. Sleep is affected by chest wall pain. Denies any anhedonia, SI or HI.  She c/o right thumb numbness and tingling, but denies any recent injury. Denies any recent cold exposure, does have h/o severe anxiety.  She also has noticed a soft bump around left side of sternal/breast area, which is not painful. Mammography was benign. Reassured about skin/soft tissue mass. She will contact if it becomes painful or grows.    Past Medical History:  Diagnosis Date   Anxiety    Hyperlipidemia    Hypothyroidism    Posttraumatic stress disorder 03/01/2013    Past Surgical History:  Procedure Laterality Date   No prior surgery      Family History  Problem Relation Age of Onset   COPD Father     Social History   Socioeconomic History   Marital status: Married    Spouse name: Not on file   Number of children: 1   Years of education: HS   Highest education level: Not on file  Occupational History   Occupation:  Occupational hygienist: OTHER    Comment: OWN/EMPLOYED AT [employer]  Tobacco Use   Smoking status: Never   Smokeless tobacco: Never  Vaping Use   Vaping Use: Never used  Substance and Sexual Activity   Alcohol use: No   Drug use: No   Sexual activity: Not Currently  Other Topics Concern   Not on file  Social History Narrative   Not on file   Social Determinants of Health   Financial Resource Strain: Low Risk    Difficulty of Paying Living Expenses: Not hard at all  Food Insecurity: No Food Insecurity   Worried About Charity fundraiser in the Last Year: Never true   [place] in the Last Year: Never true  Transportation Needs: No Transportation Needs   Lack of Transportation (Medical): No   Lack of Transportation (Non-Medical): No  Physical Activity: Inactive   Days of Exercise per Week: 0 days   Minutes of Exercise per Session: 0 min  Stress: No Stress Concern Present   Feeling of Stress : Not at all  Social Connections: Moderately Integrated   Frequency of Communication with Friends and Family: More than three times a week   Frequency of Social Gatherings with Friends and Family: Once a week   Attends Religious Services: More than 4 times per year   Active Member of Clubs or  Organizations: No   Attends Archivist Meetings: Never   Marital Status: Married  Human resources officer Violence: Not At Risk   Fear of Current or Ex-Partner: No   Emotionally Abused: No   Physically Abused: No   Sexually Abused: No    Outpatient Medications Prior to Visit  Medication Sig Dispense Refill   alendronate (FOSAMAX) 70 MG tablet Take 1 tablet (70 mg total) by mouth every 7 (seven) days. Take with a full glass of water on an empty stomach. 4 tablet 11   Cholecalciferol 25 MCG (1000 UT) tablet Take 2 tablets by mouth daily.     cyanocobalamin 1000 MCG tablet Take by mouth.     levothyroxine (SYNTHROID) 75 MCG tablet Take 1 tablet (75 mcg total) by mouth daily  before breakfast. 30 tablet 5   pravastatin (PRAVACHOL) 40 MG tablet Take 1 tablet (40 mg total) by mouth daily. 30 tablet 5   pregabalin (LYRICA) 100 MG capsule Take by mouth 2 (two) times daily.     ALPRAZolam (XANAX) 1 MG tablet Take 1 mg by mouth at bedtime as needed for sleep.     No facility-administered medications prior to visit.    Allergies  Allergen Reactions   Prednisone Other (See Comments)    Caused pain to be worse.    ROS Review of Systems  Constitutional:  Negative for chills and fever.  HENT:  Negative for congestion, sinus pressure, sinus pain and sore throat.   Eyes:  Negative for pain and discharge.  Respiratory:  Negative for cough and shortness of breath.   Cardiovascular:  Positive for palpitations. Negative for leg swelling.  Gastrointestinal:  Positive for abdominal pain. Negative for diarrhea, nausea and vomiting.  Endocrine: Negative for polydipsia and polyuria.  Genitourinary:  Negative for dysuria and hematuria.  Musculoskeletal:  Negative for neck pain and neck stiffness.  Skin:  Negative for rash.  Neurological:  Negative for dizziness and weakness.  Psychiatric/Behavioral:  Negative for agitation and behavioral problems.      Objective:    Physical Exam Vitals reviewed.  Constitutional:      General: She is not in acute distress.    Appearance: She is not diaphoretic.  HENT:     Head: Normocephalic and atraumatic.     Nose: Nose normal.     Mouth/Throat:     Mouth: Mucous membranes are moist.  Eyes:     General: No scleral icterus.    Extraocular Movements: Extraocular movements intact.  Cardiovascular:     Rate and Rhythm: Normal rate and regular rhythm.     Pulses: Normal pulses.     Heart sounds: Normal heart sounds. No murmur heard. Pulmonary:     Breath sounds: Normal breath sounds. No wheezing or rales.  Abdominal:     Palpations: Abdomen is soft.     Tenderness: There is no abdominal tenderness.  Musculoskeletal:      Cervical back: Neck supple. No tenderness.     Right lower leg: No edema.     Left lower leg: No edema.  Skin:    General: Skin is warm.     Findings: No rash.  Neurological:     General: No focal deficit present.     Mental Status: She is alert and oriented to person, place, and time.  Psychiatric:        Mood and Affect: Mood normal.        Behavior: Behavior normal.    BP 121/72 (BP Location: Left  Arm, Patient Position: Sitting, Cuff Size: Normal)   Pulse 80   Temp 97.9 F (36.6 C) (Oral)   Resp 16   Ht _0  (1.702 m)   Wt 122 lb 1.9 oz (55.4 kg)   SpO2 98%   BMI 19.13 kg/m  Wt Readings from Last 3 Encounters:  09/01/21 122 lb 1.9 oz (55.4 kg)  06/01/21 120 lb 6.4 oz (54.6 kg)  03/23/21 120 lb (54.4 kg)    Lab Results  Component Value Date   TSH 3.750 06/01/2021   Lab Results  Component Value Date   WBC 5.6 06/01/2021   HGB 13.9 06/01/2021   HCT 42.3 06/01/2021   MCV 90 06/01/2021   PLT 195 06/01/2021   Lab Results  Component Value Date   NA 141 06/01/2021   K 4.7 06/01/2021   CO2 29 06/01/2021   GLUCOSE 90 06/01/2021   BUN 14 06/01/2021   CREATININE 0.89 06/01/2021   BILITOT 0.4 06/01/2021   ALKPHOS 90 06/01/2021   AST 17 06/01/2021   ALT 14 06/01/2021   PROT 6.8 06/01/2021   ALBUMIN 4.3 06/01/2021   CALCIUM 9.8 06/01/2021   EGFR 70 06/01/2021   No results found for: CHOL No results found for: HDL No results found for: LDLCALC No results found for: TRIG No results found for: CHOLHDL No results found for: HGBA1C    Assessment & Plan:   Problem List Items Addressed This Visit       Endocrine   Acquired hypothyroidism - Primary    Lab Results  Component Value Date   TSH 3.750 06/01/2021  On Levothyroxine 75 mcg QD        Nervous and Auditory   Carpal tunnel syndrome of right wrist    Has right thumb numbness and tingling Could be a sign of carpal tunnel syndrome Advised to wear wrist brace      Relevant Medications    cyclobenzaprine (FLEXERIL) 5 MG tablet   ALPRAZolam (XANAX) 1 MG tablet     Musculoskeletal and Integument   Osteoporosis without current pathological fracture    On Alendronate Continue calcium and Vitamin D supplements        Other   Insomnia   Relevant Medications   ALPRAZolam (XANAX) 1 MG tablet   Left-sided chest wall pain    Intermittent, appears to radiating from LLQ to anterior axillary border Does not seem to cardiac related pain Unclear etiology Previous imaging studies were insignificant including CT chest Started Lyrica instead of Gabapentin Has tried Cymbalta in the past Followed by pain medicine      Chronic midline thoracic back pain    Could be contributing to radiating pain to left flank/abdominal area Avoid heavy lifting Tylenol PRN Heating pad or back brace PRN Flexeril PRN for muscle spasms      Relevant Medications   cyclobenzaprine (FLEXERIL) 5 MG tablet   GAD (generalized anxiety disorder)    Well-controlled with Xanax 1 mg PRN, takes it BID sometimes      Relevant Medications   ALPRAZolam (XANAX) 1 MG tablet    Meds ordered this encounter  Medications   cyclobenzaprine (FLEXERIL) 5 MG tablet    Sig: Take 1 tablet (5 mg total) by mouth 3 (three) times daily as needed for muscle spasms.    Dispense:  30 tablet    Refill:  1   ALPRAZolam (XANAX) 1 MG tablet    Sig: Take 1 tablet (1 mg total) by mouth 2 (two) times daily  as needed for anxiety.    Dispense:  60 tablet    Refill:  0    Follow-up: Return in about 4 months (around 12/30/2021) for Hypothyroidism, GAD and chronic pain.    Lindell Spar, MD

## 2021-09-01 NOTE — Assessment & Plan Note (Signed)
On Alendronate Continue calcium and Vitamin D supplements 

## 2021-12-08 ENCOUNTER — Other Ambulatory Visit: Payer: Self-pay | Admitting: Internal Medicine

## 2021-12-08 DIAGNOSIS — M810 Age-related osteoporosis without current pathological fracture: Secondary | ICD-10-CM

## 2021-12-29 ENCOUNTER — Encounter: Payer: Self-pay | Admitting: Internal Medicine

## 2021-12-29 ENCOUNTER — Other Ambulatory Visit: Payer: Self-pay

## 2021-12-29 ENCOUNTER — Ambulatory Visit (INDEPENDENT_AMBULATORY_CARE_PROVIDER_SITE_OTHER): Payer: PPO | Admitting: Internal Medicine

## 2021-12-29 VITALS — BP 122/82 | HR 64 | Resp 18 | Ht 67.0 in | Wt 123.0 lb

## 2021-12-29 DIAGNOSIS — Z1159 Encounter for screening for other viral diseases: Secondary | ICD-10-CM

## 2021-12-29 DIAGNOSIS — E782 Mixed hyperlipidemia: Secondary | ICD-10-CM

## 2021-12-29 DIAGNOSIS — K219 Gastro-esophageal reflux disease without esophagitis: Secondary | ICD-10-CM | POA: Diagnosis not present

## 2021-12-29 DIAGNOSIS — G4700 Insomnia, unspecified: Secondary | ICD-10-CM

## 2021-12-29 DIAGNOSIS — M810 Age-related osteoporosis without current pathological fracture: Secondary | ICD-10-CM | POA: Diagnosis not present

## 2021-12-29 DIAGNOSIS — F411 Generalized anxiety disorder: Secondary | ICD-10-CM | POA: Diagnosis not present

## 2021-12-29 DIAGNOSIS — E039 Hypothyroidism, unspecified: Secondary | ICD-10-CM

## 2021-12-29 DIAGNOSIS — R0789 Other chest pain: Secondary | ICD-10-CM | POA: Diagnosis not present

## 2021-12-29 MED ORDER — LEVOTHYROXINE SODIUM 75 MCG PO TABS: 75.0000 ug | ORAL_TABLET | Freq: Every day | ORAL | 1 refills | Status: DC

## 2021-12-29 MED ORDER — PRAVASTATIN SODIUM 40 MG PO TABS: 40.0000 mg | ORAL_TABLET | Freq: Every day | ORAL | 3 refills | Status: DC

## 2021-12-29 MED ORDER — OMEPRAZOLE 20 MG PO CPDR: 20.0000 mg | DELAYED_RELEASE_CAPSULE | Freq: Every day | ORAL | 3 refills | Status: DC

## 2021-12-29 MED ORDER — ALPRAZOLAM 1 MG PO TABS: 1.0000 mg | ORAL_TABLET | Freq: Two times a day (BID) | ORAL | 2 refills | Status: DC | PRN

## 2021-12-29 MED ORDER — ALENDRONATE SODIUM 70 MG PO TABS: ORAL_TABLET | ORAL | 3 refills | Status: DC

## 2021-12-29 NOTE — Assessment & Plan Note (Signed)
On Alendronate Continue calcium and Vitamin D supplements 

## 2021-12-29 NOTE — Assessment & Plan Note (Signed)
Lab Results  ?Component Value Date  ? TSH 3.750 06/01/2021  ? ?On Levothyroxine 75 mcg QD ?

## 2021-12-29 NOTE — Progress Notes (Signed)
Established Patient Office Visit  Subjective:  Patient ID: Ruth Jennings, female    DOB: Jun 06, 1951  Age: 71 y.o. MRN: [id number]  CC:  Chief Complaint  Patient presents with   Follow-up    4 month follow up hypothyroidism GAD and chronic pain still having left side pain     HPI Ruth Jennings is a 71 y.o. female with past medical history of hypothyroidism, CKD stage 3a, anxiety and HLD who presents for f/u of her chronic medical conditions.  Left sided chest wall pain: Chronic, intermittent, worse at night radiating from LLQ abdomen. Associated with palpitations. Has tried Cymbalta, Gabapentin and Lyrica. Has had CT chest and Cardiology evaluation.  Today, she reports that her pain is more towards left inferior scapular border.  She requests a referral to pain clinic in [place].  Hypothyroidism: Takes Levothyroxine 75 mcg QD. Denies any constipation, diarrhea, recent change in appetite or weight.   Anxiety: Well-controlled with Xanax PRN. Sleep is affected by chest wall pain. Denies any anhedonia, SI or HI.    Past Medical History:  Diagnosis Date   Anxiety    Hyperlipidemia    Hypothyroidism    Posttraumatic stress disorder 03/01/2013    Past Surgical History:  Procedure Laterality Date   No prior surgery      Family History  Problem Relation Age of Onset   COPD Father     Social History   Socioeconomic History   Marital status: Married    Spouse name: Not on file   Number of children: 1   Years of education: HS   Highest education level: Not on file  Occupational History   Occupation: Occupational hygienist: OTHER    Comment: OWN/EMPLOYED AT [employer]  Tobacco Use   Smoking status: Never   Smokeless tobacco: Never  Vaping Use   Vaping Use: Never used  Substance and Sexual Activity   Alcohol use: No   Drug use: No   Sexual activity: Not Currently  Other Topics Concern   Not on file  Social History Narrative   Not on file   Social  Determinants of Health   Financial Resource Strain: Low Risk    Difficulty of Paying Living Expenses: Not hard at all  Food Insecurity: No Food Insecurity   Worried About Charity fundraiser in the Last Year: Never true   [place] in the Last Year: Never true  Transportation Needs: No Transportation Needs   Lack of Transportation (Medical): No   Lack of Transportation (Non-Medical): No  Physical Activity: Inactive   Days of Exercise per Week: 0 days   Minutes of Exercise per Session: 0 min  Stress: No Stress Concern Present   Feeling of Stress : Not at all  Social Connections: Moderately Integrated   Frequency of Communication with Friends and Family: More than three times a week   Frequency of Social Gatherings with Friends and Family: Once a week   Attends Religious Services: More than 4 times per year   Active Member of [employer] or Organizations: No   Attends Archivist Meetings: Never   Marital Status: Married  Human resources officer Violence: Not At Risk   Fear of Current or Ex-Partner: No   Emotionally Abused: No   Physically Abused: No   Sexually Abused: No    Outpatient Medications Prior to Visit  Medication Sig Dispense Refill   Cholecalciferol 25 MCG (1000 UT) tablet Take 2 tablets by  mouth daily.     cyanocobalamin 1000 MCG tablet Take by mouth.     cyclobenzaprine (FLEXERIL) 5 MG tablet Take 1 tablet (5 mg total) by mouth 3 (three) times daily as needed for muscle spasms. 30 tablet 1   alendronate (FOSAMAX) 70 MG tablet TAKE 1 TABLET EVERY WEEK IN THE MORNING 30 MIN BEFORE EATING WITH AN 8OZ GLASS OF WATER (SIT UP 30 MIN) 4 tablet 0   ALPRAZolam (XANAX) 1 MG tablet Take 1 tablet (1 mg total) by mouth 2 (two) times daily as needed for anxiety. 60 tablet 0   levothyroxine (SYNTHROID) 75 MCG tablet Take 1 tablet (75 mcg total) by mouth daily before breakfast. 30 tablet 5   pravastatin (PRAVACHOL) 40 MG tablet Take 1 tablet (40 mg total) by mouth daily. 30 tablet  5   pregabalin (LYRICA) 100 MG capsule Take by mouth 2 (two) times daily.     No facility-administered medications prior to visit.    Allergies  Allergen Reactions   Prednisone Other (See Comments)    Caused pain to be worse.    ROS Review of Systems  Constitutional:  Negative for chills and fever.  HENT:  Negative for congestion, sinus pressure, sinus pain and sore throat.   Eyes:  Negative for pain and discharge.  Respiratory:  Negative for cough and shortness of breath.   Cardiovascular:  Positive for palpitations. Negative for leg swelling.  Gastrointestinal:  Positive for abdominal pain. Negative for diarrhea, nausea and vomiting.  Endocrine: Negative for polydipsia and polyuria.  Genitourinary:  Negative for dysuria and hematuria.  Musculoskeletal:  Negative for neck pain and neck stiffness.  Skin:  Negative for rash.  Neurological:  Negative for dizziness and weakness.  Psychiatric/Behavioral:  Negative for agitation and behavioral problems.      Objective:    Physical Exam Vitals reviewed.  Constitutional:      General: She is not in acute distress.    Appearance: She is not diaphoretic.  HENT:     Head: Normocephalic and atraumatic.     Nose: Nose normal.     Mouth/Throat:     Mouth: Mucous membranes are moist.  Eyes:     General: No scleral icterus.    Extraocular Movements: Extraocular movements intact.  Cardiovascular:     Rate and Rhythm: Normal rate and regular rhythm.     Pulses: Normal pulses.     Heart sounds: Normal heart sounds. No murmur heard. Pulmonary:     Breath sounds: Normal breath sounds. No wheezing or rales.  Abdominal:     Palpations: Abdomen is soft.     Tenderness: There is no abdominal tenderness.  Musculoskeletal:     Cervical back: Neck supple. No tenderness.     Right lower leg: No edema.     Left lower leg: No edema.  Skin:    General: Skin is warm.     Findings: No rash.  Neurological:     General: No focal deficit  present.     Mental Status: She is alert and oriented to person, place, and time.  Psychiatric:        Mood and Affect: Mood normal.        Behavior: Behavior normal.    BP 122/82 (BP Location: Left Arm, Patient Position: Sitting, Cuff Size: Normal)    Pulse 64    Resp 18    Ht $R'5\' 7"'fw$  (1.702 m)    Wt 123 lb (55.8 kg)    SpO2 99%  BMI 19.26 kg/m  Wt Readings from Last 3 Encounters:  12/29/21 123 lb (55.8 kg)  09/01/21 122 lb 1.9 oz (55.4 kg)  06/01/21 120 lb 6.4 oz (54.6 kg)    Lab Results  Component Value Date   TSH 3.750 06/01/2021   Lab Results  Component Value Date   WBC 5.6 06/01/2021   HGB 13.9 06/01/2021   HCT 42.3 06/01/2021   MCV 90 06/01/2021   PLT 195 06/01/2021   Lab Results  Component Value Date   NA 141 06/01/2021   K 4.7 06/01/2021   CO2 29 06/01/2021   GLUCOSE 90 06/01/2021   BUN 14 06/01/2021   CREATININE 0.89 06/01/2021   BILITOT 0.4 06/01/2021   ALKPHOS 90 06/01/2021   AST 17 06/01/2021   ALT 14 06/01/2021   PROT 6.8 06/01/2021   ALBUMIN 4.3 06/01/2021   CALCIUM 9.8 06/01/2021   EGFR 70 06/01/2021   No results found for: CHOL No results found for: HDL No results found for: LDLCALC No results found for: TRIG No results found for: CHOLHDL No results found for: HGBA1C    Assessment & Plan:   Problem List Items Addressed This Visit       Endocrine   Acquired hypothyroidism    Lab Results  Component Value Date   TSH 3.750 06/01/2021  On Levothyroxine 75 mcg QD      Relevant Medications   levothyroxine (SYNTHROID) 75 MCG tablet   Other Relevant Orders   Basic Metabolic Panel (BMET)   TSH + free T4     Musculoskeletal and Integument   Osteoporosis without current pathological fracture    On Alendronate Continue calcium and Vitamin D supplements      Relevant Medications   alendronate (FOSAMAX) 70 MG tablet     Other   Hyperlipidemia    On statin Check lipid profile      Relevant Medications   pravastatin (PRAVACHOL) 40  MG tablet   Other Relevant Orders   Lipid Profile   Insomnia   Relevant Medications   ALPRAZolam (XANAX) 1 MG tablet   Left-sided chest wall pain - Primary    Intermittent, appears to radiating from LLQ to anterior axillary border Does not seem to cardiac related pain Unclear etiology Previous imaging studies were insignificant including CT chest Has tried Cymbalta, Gabapentin and Lyrica in the past Referred to pain clinic - Dr. Nicholaus Bloom      Relevant Orders   Ambulatory referral to Pain Clinic   GAD (generalized anxiety disorder)    Well-controlled with Xanax 1 mg PRN, takes it BID sometimes Refilled after reviewing PDMP today      Relevant Medications   ALPRAZolam (XANAX) 1 MG tablet   Other Visit Diagnoses     Gastroesophageal reflux disease, unspecified whether esophagitis present       Relevant Medications   omeprazole (PRILOSEC) 20 MG capsule   Need for hepatitis C screening test       Relevant Orders   Hepatitis C Antibody       Meds ordered this encounter  Medications   alendronate (FOSAMAX) 70 MG tablet    Sig: TAKE 1 TABLET EVERY WEEK IN THE MORNING 30 MIN BEFORE EATING WITH AN 8OZ GLASS OF WATER (SIT UP 30 MIN)    Dispense:  12 tablet    Refill:  3   pravastatin (PRAVACHOL) 40 MG tablet    Sig: Take 1 tablet (40 mg total) by mouth daily.    Dispense:  90 tablet    Refill:  3   levothyroxine (SYNTHROID) 75 MCG tablet    Sig: Take 1 tablet (75 mcg total) by mouth daily before breakfast.    Dispense:  90 tablet    Refill:  1   omeprazole (PRILOSEC) 20 MG capsule    Sig: Take 1 capsule (20 mg total) by mouth daily.    Dispense:  30 capsule    Refill:  3   ALPRAZolam (XANAX) 1 MG tablet    Sig: Take 1 tablet (1 mg total) by mouth 2 (two) times daily as needed for anxiety.    Dispense:  60 tablet    Refill:  2    Follow-up: Return in about 4 months (around 04/30/2022) for Annual physical.    Lindell Spar, MD

## 2021-12-29 NOTE — Patient Instructions (Signed)
Please start taking Omeprazole as needed for acid reflux or upper abdominal discomfort. ? ?Please continue to take other medications as prescribed. ? ?You are being referred to pain clinic. ?

## 2021-12-29 NOTE — Assessment & Plan Note (Signed)
Well-controlled with Xanax 1 mg PRN, takes it BID sometimes Refilled after reviewing PDMP today 

## 2021-12-29 NOTE — Assessment & Plan Note (Signed)
On statin Check lipid profile 

## 2021-12-29 NOTE — Assessment & Plan Note (Signed)
Intermittent, appears to radiating from LLQ to anterior axillary border ?Does not seem to cardiac related pain ?Unclear etiology ?Previous imaging studies were insignificant including CT chest ?Has tried Cymbalta, Gabapentin and Lyrica in the past ?Referred to pain clinic - Dr. Nicholaus Bloom ?

## 2021-12-30 LAB — HEPATITIS C ANTIBODY: Hep C Virus Ab: NONREACTIVE

## 2022-01-05 LAB — BASIC METABOLIC PANEL
BUN/Creatinine Ratio: 18 (ref 12–28)
BUN: 17 mg/dL (ref 8–27)
CO2: 21 mmol/L (ref 20–29)
Calcium: 9.3 mg/dL (ref 8.7–10.3)
Chloride: 103 mmol/L (ref 96–106)
Creatinine, Ser: 0.97 mg/dL (ref 0.57–1.00)
Glucose: 92 mg/dL (ref 70–99)
Potassium: 3.7 mmol/L (ref 3.5–5.2)
Sodium: 142 mmol/L (ref 134–144)
eGFR: 63 mL/min/{1.73_m2} (ref >59)

## 2022-01-05 LAB — LIPID PANEL
Chol/HDL Ratio: 2.6 ratio (ref 0.0–4.4)
Cholesterol, Total: 184 mg/dL (ref 100–199)
HDL: 70 mg/dL (ref >39)
LDL Chol Calc (NIH): 94 mg/dL (ref 0–99)
Triglycerides: 116 mg/dL (ref 0–149)
VLDL Cholesterol Cal: 20 mg/dL (ref 5–40)

## 2022-01-05 LAB — TSH+FREE T4
Free T4: 1.88 ng/dL — ABNORMAL HIGH (ref 0.82–1.77)
TSH: 0.172 u[IU]/mL — ABNORMAL LOW (ref 0.450–4.500)

## 2022-04-25 ENCOUNTER — Other Ambulatory Visit (HOSPITAL_COMMUNITY): Payer: Self-pay | Admitting: Internal Medicine

## 2022-04-25 DIAGNOSIS — Z1231 Encounter for screening mammogram for malignant neoplasm of breast: Secondary | ICD-10-CM

## 2022-05-05 ENCOUNTER — Encounter: Payer: Self-pay | Admitting: Internal Medicine

## 2022-05-05 ENCOUNTER — Ambulatory Visit (INDEPENDENT_AMBULATORY_CARE_PROVIDER_SITE_OTHER): Payer: PPO | Admitting: Internal Medicine

## 2022-05-05 VITALS — BP 118/78 | HR 73 | Resp 18 | Ht 67.0 in | Wt 122.0 lb

## 2022-05-05 DIAGNOSIS — Z0001 Encounter for general adult medical examination with abnormal findings: Secondary | ICD-10-CM | POA: Diagnosis not present

## 2022-05-05 DIAGNOSIS — M546 Pain in thoracic spine: Secondary | ICD-10-CM

## 2022-05-05 DIAGNOSIS — G8929 Other chronic pain: Secondary | ICD-10-CM | POA: Diagnosis not present

## 2022-05-05 DIAGNOSIS — R0789 Other chest pain: Secondary | ICD-10-CM

## 2022-05-05 DIAGNOSIS — E039 Hypothyroidism, unspecified: Secondary | ICD-10-CM | POA: Diagnosis not present

## 2022-05-05 MED ORDER — METHYLPREDNISOLONE ACETATE 80 MG/ML IJ SUSP
80.0000 mg | Freq: Once | INTRAMUSCULAR | Status: AC
  Administered 2022-05-05: 80 mg via INTRAMUSCULAR

## 2022-05-05 MED ORDER — PREGABALIN 75 MG PO CAPS: 75.0000 mg | ORAL_CAPSULE | Freq: Two times a day (BID) | ORAL | 2 refills | Status: DC

## 2022-05-05 NOTE — Assessment & Plan Note (Addendum)
Has component of thoracic spine area pain, could have referred pain to left-sided axillary and chest wall area Has tried Tylenol, Flexeril, gabapentin, Cymbalta and Lyrica with minimal to no relief Depo-Medrol IM today Restarted Lyrica 75 mg twice daily for now Check MRI of thoracic spine

## 2022-05-05 NOTE — Assessment & Plan Note (Signed)
Lab Results  Component Value Date   TSH 0.172 (L) 12/29/2021   On Levothyroxine 75 mcg QD Recheck TSH and free T4

## 2022-05-05 NOTE — Progress Notes (Signed)
Established Patient Office Visit  Subjective:  Patient ID: Ruth Jennings, female    DOB: February 28, 1951  Age: 71 y.o. MRN: [id number]  CC:  Chief Complaint  Patient presents with   Annual Exam    Annual exam pt referral was sent to pain management patient has tried numerous times to get appt however they have yet to schedule also patient is having left shoulder pain for 1 month would like cortisone shot     HPI Ruth Jennings is a 71 y.o. female with past medical history of hypothyroidism, CKD stage 3a, anxiety and HLD who presents for annual physical.  Left sided chest wall pain: Chronic, intermittent, worse at night radiating from LLQ abdomen and thoracic spine area. Associated with palpitations. Has tried Cymbalta, Gabapentin and Lyrica. Has had CT chest and Cardiology evaluation. She was referred to pain clinic in [place], but was not able to see them.   Hypothyroidism: Takes Levothyroxine 75 mcg QD. Denies any constipation, diarrhea, recent change in appetite or weight.   Anxiety: Well-controlled with Xanax PRN. Sleep is affected by chest wall pain. Denies any anhedonia, SI or HI.     Past Medical History:  Diagnosis Date   Anxiety    Hyperlipidemia    Hypothyroidism    Posttraumatic stress disorder 03/01/2013    Past Surgical History:  Procedure Laterality Date   No prior surgery      Family History  Problem Relation Age of Onset   COPD Father     Social History   Socioeconomic History   Marital status: Married    Spouse name: Not on file   Number of children: 1   Years of education: HS   Highest education level: Not on file  Occupational History   Occupation: Occupational hygienist: OTHER    Comment: OWN/EMPLOYED AT [employer]  Tobacco Use   Smoking status: Never   Smokeless tobacco: Never  Vaping Use   Vaping Use: Never used  Substance and Sexual Activity   Alcohol use: No   Drug use: No   Sexual activity: Not Currently  Other Topics  Concern   Not on file  Social History Narrative   Not on file   Social Determinants of Health   Financial Resource Strain: Low Risk  (07/06/2021)   Overall Financial Resource Strain (CARDIA)    Difficulty of Paying Living Expenses: Not hard at all  Food Insecurity: No Food Insecurity (07/06/2021)   Hunger Vital Sign    Worried About Running Out of Food in the Last Year: Never true    [place] in the Last Year: Never true  Transportation Needs: No Transportation Needs (07/06/2021)   PRAPARE - Hydrologist (Medical): No    Lack of Transportation (Non-Medical): No  Physical Activity: Inactive (07/06/2021)   Exercise Vital Sign    Days of Exercise per Week: 0 days    Minutes of Exercise per Session: 0 min  Stress: No Stress Concern Present (07/06/2021)   [place]    Feeling of Stress : Not at all  Social Connections: Moderately Integrated (07/06/2021)   Social Connection and Isolation Panel [NHANES]    Frequency of Communication with Friends and Family: More than three times a week    Frequency of Social Gatherings with Friends and Family: Once a week    Attends Religious Services: More than 4 times per year  Active Member of Clubs or Organizations: No    Attends Archivist Meetings: Never    Marital Status: Married  Human resources officer Violence: Not At Risk (07/06/2021)   Humiliation, Afraid, Rape, and Kick questionnaire    Fear of Current or Ex-Partner: No    Emotionally Abused: No    Physically Abused: No    Sexually Abused: No    Outpatient Medications Prior to Visit  Medication Sig Dispense Refill   alendronate (FOSAMAX) 70 MG tablet TAKE 1 TABLET EVERY WEEK IN THE MORNING 30 MIN BEFORE EATING WITH AN 8OZ GLASS OF WATER (SIT UP 30 MIN) 12 tablet 3   ALPRAZolam (XANAX) 1 MG tablet Take 1 tablet (1 mg total) by mouth 2 (two) times daily as needed for anxiety. 60 tablet 2    Cholecalciferol 25 MCG (1000 UT) tablet Take 2 tablets by mouth daily.     cyanocobalamin 1000 MCG tablet Take by mouth.     cyclobenzaprine (FLEXERIL) 5 MG tablet Take 1 tablet (5 mg total) by mouth 3 (three) times daily as needed for muscle spasms. 30 tablet 1   levothyroxine (SYNTHROID) 75 MCG tablet Take 1 tablet (75 mcg total) by mouth daily before breakfast. 90 tablet 1   omeprazole (PRILOSEC) 20 MG capsule Take 1 capsule (20 mg total) by mouth daily. 30 capsule 3   pravastatin (PRAVACHOL) 40 MG tablet Take 1 tablet (40 mg total) by mouth daily. 90 tablet 3   No facility-administered medications prior to visit.    Allergies  Allergen Reactions   Prednisone Other (See Comments)    Caused pain to be worse.    ROS Review of Systems  Constitutional:  Negative for chills and fever.  HENT:  Negative for congestion, sinus pressure, sinus pain and sore throat.   Eyes:  Negative for pain and discharge.  Respiratory:  Negative for cough and shortness of breath.   Cardiovascular:  Positive for palpitations. Negative for leg swelling.  Gastrointestinal:  Positive for abdominal pain. Negative for diarrhea, nausea and vomiting.  Endocrine: Negative for polydipsia and polyuria.  Genitourinary:  Negative for dysuria and hematuria.  Musculoskeletal:  Negative for neck pain and neck stiffness.  Skin:  Negative for rash.  Neurological:  Negative for dizziness and weakness.  Psychiatric/Behavioral:  Negative for agitation and behavioral problems.       Objective:    Physical Exam Vitals reviewed.  Constitutional:      General: She is not in acute distress.    Appearance: She is not diaphoretic.  HENT:     Head: Normocephalic and atraumatic.     Nose: Nose normal.     Mouth/Throat:     Mouth: Mucous membranes are moist.  Eyes:     General: No scleral icterus.    Extraocular Movements: Extraocular movements intact.  Cardiovascular:     Rate and Rhythm: Normal rate and regular rhythm.      Pulses: Normal pulses.     Heart sounds: Normal heart sounds. No murmur heard. Pulmonary:     Breath sounds: Normal breath sounds. No wheezing or rales.  Abdominal:     Palpations: Abdomen is soft.     Tenderness: There is no abdominal tenderness.  Musculoskeletal:     Cervical back: Neck supple. No tenderness.     Thoracic back: Tenderness present. Decreased range of motion (Due to pain).     Right lower leg: No edema.     Left lower leg: No edema.  Skin:  General: Skin is warm.     Findings: No rash.  Neurological:     General: No focal deficit present.     Mental Status: She is alert and oriented to person, place, and time.     Cranial Nerves: No cranial nerve deficit.     Sensory: No sensory deficit.     Motor: No weakness.  Psychiatric:        Mood and Affect: Mood normal.        Behavior: Behavior normal.     BP 118/78 (BP Location: Left Arm, Patient Position: Sitting, Cuff Size: Normal)   Pulse 73   Resp 18   Ht $R'5\' 7"'LR$  (1.702 m)   Wt 122 lb (55.3 kg)   SpO2 98%   BMI 19.11 kg/m  Wt Readings from Last 3 Encounters:  05/05/22 122 lb (55.3 kg)  12/29/21 123 lb (55.8 kg)  09/01/21 122 lb 1.9 oz (55.4 kg)    Lab Results  Component Value Date   TSH 0.172 (L) 12/29/2021   Lab Results  Component Value Date   WBC 5.6 06/01/2021   HGB 13.9 06/01/2021   HCT 42.3 06/01/2021   MCV 90 06/01/2021   PLT 195 06/01/2021   Lab Results  Component Value Date   NA 142 12/29/2021   K 3.7 12/29/2021   CO2 21 12/29/2021   GLUCOSE 92 12/29/2021   BUN 17 12/29/2021   CREATININE 0.97 12/29/2021   BILITOT 0.4 06/01/2021   ALKPHOS 90 06/01/2021   AST 17 06/01/2021   ALT 14 06/01/2021   PROT 6.8 06/01/2021   ALBUMIN 4.3 06/01/2021   CALCIUM 9.3 12/29/2021   EGFR 63 12/29/2021   Lab Results  Component Value Date   CHOL 184 12/29/2021   Lab Results  Component Value Date   HDL 70 12/29/2021   Lab Results  Component Value Date   LDLCALC 94 12/29/2021   Lab  Results  Component Value Date   TRIG 116 12/29/2021   Lab Results  Component Value Date   CHOLHDL 2.6 12/29/2021   No results found for: "HGBA1C"    Assessment & Plan:   Problem List Items Addressed This Visit       Endocrine   Acquired hypothyroidism    Lab Results  Component Value Date   TSH 0.172 (L) 12/29/2021  On Levothyroxine 75 mcg QD Recheck TSH and free T4      Relevant Orders   TSH + free T4     Other   Left-sided chest wall pain    Intermittent, appears to radiating from LLQ to anterior axillary border Does not seem to cardiac related pain Unclear etiology Previous imaging studies were insignificant including CT chest Has tried Cymbalta, Gabapentin and Lyrica in the past Referred to pain clinic      Relevant Medications   pregabalin (LYRICA) 75 MG capsule   Other Relevant Orders   Ambulatory referral to Pain Clinic   MR Thoracic Spine Wo Contrast   Chronic midline thoracic back pain    Has component of thoracic spine area pain, could have referred pain to left-sided axillary and chest wall area Has tried Tylenol, Flexeril, gabapentin, Cymbalta and Lyrica with minimal to no relief Depo-Medrol IM today Restarted Lyrica 75 mg twice daily for now Check MRI of thoracic spine      Relevant Medications   pregabalin (LYRICA) 75 MG capsule   Other Relevant Orders   Ambulatory referral to Pain Clinic   MR Thoracic Spine Wo  Contrast   Encounter for general adult medical examination with abnormal findings - Primary    Physical exam as documented. Counseling done  re healthy lifestyle involving commitment to 150 minutes exercise per week, heart healthy diet, and attaining healthy weight.The importance of adequate sleep also discussed. Changes in health habits are decided on by the patient with goals and time frames  set for achieving them. Immunization and cancer screening needs are specifically addressed at this visit.        Meds ordered this  encounter  Medications   pregabalin (LYRICA) 75 MG capsule    Sig: Take 1 capsule (75 mg total) by mouth 2 (two) times daily.    Dispense:  60 capsule    Refill:  2   methylPREDNISolone acetate (DEPO-MEDROL) injection 80 mg    Follow-up: Return in about 4 months (around 09/05/2022).    Lindell Spar, MD

## 2022-05-05 NOTE — Assessment & Plan Note (Signed)

## 2022-05-05 NOTE — Patient Instructions (Signed)
Please start taking Lyrica as prescribed.  Please continue taking other medications as prescribed.  You are being referred to pain clinic.  Dr Sherlyn Lees [address], [place], [place] [address] [phone]

## 2022-05-05 NOTE — Assessment & Plan Note (Signed)
Intermittent, appears to radiating from LLQ to anterior axillary border Does not seem to cardiac related pain Unclear etiology Previous imaging studies were insignificant including CT chest Has tried Cymbalta, Gabapentin and Lyrica in the past Referred to pain clinic

## 2022-05-06 LAB — TSH+FREE T4
Free T4: 1.56 ng/dL (ref 0.82–1.77)
TSH: 0.097 u[IU]/mL — ABNORMAL LOW (ref 0.450–4.500)

## 2022-05-06 MED ORDER — LEVOTHYROXINE SODIUM 50 MCG PO TABS: 50.0000 ug | ORAL_TABLET | Freq: Every day | ORAL | 0 refills | Status: DC

## 2022-05-06 NOTE — Addendum Note (Signed)
Addended byIhor Dow on: 05/06/2022 07:59 AM   Modules accepted: Orders

## 2022-05-20 ENCOUNTER — Ambulatory Visit (HOSPITAL_COMMUNITY)
Admission: RE | Admit: 2022-05-20 | Discharge: 2022-05-20 | Disposition: A | Discharge status: Home / Self Care | Payer: PPO | Source: Ambulatory Visit | Attending: Internal Medicine | Admitting: Internal Medicine

## 2022-05-20 DIAGNOSIS — R0789 Other chest pain: Secondary | ICD-10-CM | POA: Diagnosis not present

## 2022-05-20 DIAGNOSIS — G8929 Other chronic pain: Secondary | ICD-10-CM | POA: Insufficient documentation

## 2022-05-20 DIAGNOSIS — Z1231 Encounter for screening mammogram for malignant neoplasm of breast: Secondary | ICD-10-CM | POA: Diagnosis not present

## 2022-05-20 DIAGNOSIS — M546 Pain in thoracic spine: Secondary | ICD-10-CM | POA: Diagnosis not present

## 2022-05-20 DIAGNOSIS — M549 Dorsalgia, unspecified: Secondary | ICD-10-CM | POA: Diagnosis not present

## 2022-06-13 DIAGNOSIS — M47814 Spondylosis without myelopathy or radiculopathy, thoracic region: Secondary | ICD-10-CM | POA: Insufficient documentation

## 2022-06-13 DIAGNOSIS — G588 Other specified mononeuropathies: Secondary | ICD-10-CM | POA: Diagnosis not present

## 2022-06-20 DIAGNOSIS — G588 Other specified mononeuropathies: Secondary | ICD-10-CM | POA: Diagnosis not present

## 2022-06-28 DIAGNOSIS — G588 Other specified mononeuropathies: Secondary | ICD-10-CM | POA: Diagnosis not present

## 2022-06-28 DIAGNOSIS — M47814 Spondylosis without myelopathy or radiculopathy, thoracic region: Secondary | ICD-10-CM | POA: Diagnosis not present

## 2022-07-11 ENCOUNTER — Ambulatory Visit (INDEPENDENT_AMBULATORY_CARE_PROVIDER_SITE_OTHER): Payer: PPO

## 2022-07-11 DIAGNOSIS — Z0000 Encounter for general adult medical examination without abnormal findings: Secondary | ICD-10-CM | POA: Diagnosis not present

## 2022-07-11 NOTE — Progress Notes (Signed)
I connected with  Ruth Jennings on 07/11/22 by a audio enabled telemedicine application and verified that I am speaking with the correct person using two identifiers.  Patient Location: Home  Provider Location: Home Office  I discussed the limitations of evaluation and management by telemedicine. The patient expressed understanding and agreed to proceed.  Subjective:   Ruth Jennings is a 71 y.o. female who presents for Medicare Annual (Subsequent) preventive examination.  Review of Systems     Cardiac Risk Factors include: dyslipidemia;hypertension;advanced age (>78mn, >>49women)     Objective:    There were no vitals filed for this visit. There is no height or weight on file to calculate BMI.     07/11/2022    1:49 PM 07/06/2021    2:13 PM  Advanced Directives  Does Patient Have a Medical Advance Directive? No Yes  Type of ACorporate treasure[place]f [place] will  Would patient like information on creating a medical advance directive? Yes (ED - Information included in AVS)     Current Medications (verified) Outpatient Encounter Medications as of 07/11/2022  Medication Sig   alendronate (FOSAMAX) 70 MG tablet TAKE 1 TABLET EVERY WEEK IN THE MORNING 30 MIN BEFORE EATING WITH AN 8OZ GLASS OF WATER (SIT UP 30 MIN)   ALPRAZolam (XANAX) 1 MG tablet Take 1 tablet (1 mg total) by mouth 2 (two) times daily as needed for anxiety.   Cholecalciferol 25 MCG (1000 UT) tablet Take 2 tablets by mouth daily.   cyanocobalamin 1000 MCG tablet Take by mouth.   cyclobenzaprine (FLEXERIL) 5 MG tablet Take 1 tablet (5 mg total) by mouth 3 (three) times daily as needed for muscle spasms.   levothyroxine (SYNTHROID) 50 MCG tablet Take 1 tablet (50 mcg total) by mouth daily before breakfast.   omeprazole (PRILOSEC) 20 MG capsule Take 1 capsule (20 mg total) by mouth daily.   pravastatin (PRAVACHOL) 40 MG tablet Take 1 tablet (40 mg total) by mouth daily.   pregabalin (LYRICA) 75 MG  capsule Take 1 capsule (75 mg total) by mouth 2 (two) times daily.   No facility-administered encounter medications on file as of 07/11/2022.    Allergies (verified) Prednisone   History: Past Medical History:  Diagnosis Date   Anxiety    Hyperlipidemia    Hypothyroidism    Posttraumatic stress disorder 03/01/2013   Past Surgical History:  Procedure Laterality Date   No prior surgery     Family History  Problem Relation Age of Onset   COPD Father    Social History   Socioeconomic History   Marital status: Married    Spouse name: Not on file   Number of children: 1   Years of education: HS   Highest education level: Not on file  Occupational History   Occupation: BOccupational hygienist OTHER    Comment: OWN/EMPLOYED AT [employer]  Tobacco Use   Smoking status: Never   Smokeless tobacco: Never  Vaping Use   Vaping Use: Never used  Substance and Sexual Activity   Alcohol use: No   Drug use: No   Sexual activity: Not Currently  Other Topics Concern   Not on file  Social History Narrative   Not on file   Social Determinants of Health   Financial Resource Strain: Low Risk  (07/11/2022)   Overall Financial Resource Strain (CARDIA)    Difficulty of Paying Living Expenses: Not hard at all  Food Insecurity: No Food  Insecurity (07/06/2021)   Hunger Vital Sign    Worried About Running Out of Food in the Last Year: Never true    Ran Out of Food in the Last Year: Never true  Transportation Needs: No Transportation Needs (07/11/2022)   PRAPARE - Hydrologist (Medical): No    Lack of Transportation (Non-Medical): No  Physical Activity: Sufficiently Active (07/11/2022)   Exercise Vital Sign    Days of Exercise per Week: 6 days    Minutes of Exercise per Session: 30 min  Stress: No Stress Concern Present (07/06/2021)   [place]    Feeling of Stress : Not at all   Social Connections: Moderately Integrated (07/11/2022)   Social Connection and Isolation Panel [NHANES]    Frequency of Communication with Friends and Family: More than three times a week    Frequency of Social Gatherings with Friends and Family: More than three times a week    Attends Religious Services: 1 to 4 times per year    Active Member of [employer] or Organizations: No    Attends Archivist Meetings: Never    Marital Status: Married    Tobacco Counseling Counseling given: Not Answered   Clinical Intake:  Pre-visit preparation completed: Yes        Diabetes: No  How often do you need to have someone help you when you read instructions, pamphlets, or other written materials from your doctor or pharmacy?: 1 - Never  Diabetic?no         Activities of Daily Living    07/11/2022    2:13 PM  In your present state of health, do you have any difficulty performing the following activities:  Hearing? 0  Vision? 0  Difficulty concentrating or making decisions? 0  Walking or climbing stairs? 0  Dressing or bathing? 0  Doing errands, shopping? 0  Preparing Food and eating ? N  Using the Toilet? N  In the past six months, have you accidently leaked urine? N  Do you have problems with loss of bowel control? N  Managing your Medications? N  Managing your Finances? N  Housekeeping or managing your Housekeeping? N    Patient Care Team: Lindell Spar, MD as PCP - General (Internal Medicine) Satira Sark, MD as PCP - Cardiology (Cardiology)  Indicate any recent Medical Services you may have received from other than [facility] providers in the past year (date may be approximate).     Assessment:   This is a routine wellness examination for Ruth Jennings.  Hearing/Vision screen No results found.  Dietary issues and exercise activities discussed: Current Exercise Habits: Home exercise routine, Type of exercise: walking, Time (Minutes): 30, Frequency (Times/Week): 6,  Weekly Exercise (Minutes/Week): 180, Intensity: Mild   Goals Addressed   None    Depression Screen    05/05/2022    1:57 PM 12/29/2021    2:35 PM 09/01/2021    2:03 PM 07/06/2021    2:10 PM 06/01/2021   10:18 AM 03/23/2021    1:46 PM 01/27/2021    2:44 PM  PHQ 2/9 Scores  PHQ - 2 Score 3 0 0 0 0 4 0  PHQ- 9 Score 9     9     Fall Risk    07/11/2022    2:02 PM 05/05/2022    1:56 PM 12/29/2021    2:35 PM 09/01/2021    2:03 PM 07/06/2021  2:13 PM  Fall Risk   Falls in the past year? 0 0 0 0 0  Number falls in past yr: 0 0 0 0 0  Injury with Fall? 0 0 0 0 0  Risk for fall due to :  No Fall Risks No Fall Risks No Fall Risks No Fall Risks  Follow up  Falls evaluation completed Falls evaluation completed Falls evaluation completed Falls evaluation completed    [place]:  Any stairs in or around the home? Yes  If so, are there any without handrails? Yes  Home free of loose throw rugs in walkways, pet beds, electrical cords, etc? Yes  Adequate lighting in your home to reduce risk of falls? Yes   ASSISTIVE DEVICES UTILIZED TO PREVENT FALLS:  Life alert? No  Use of a cane, walker or w/c? No  Grab bars in the bathroom? Yes  Shower chair or bench in shower? No  Elevated toilet seat or a handicapped toilet? No    Cognitive Function:        07/11/2022    2:02 PM 07/06/2021    2:15 PM  6CIT Screen  What Year? 0 points 0 points  What month? 0 points 0 points  What time? 0 points 0 points  Count back from 20 0 points 0 points  Months in reverse 0 points 0 points  Repeat phrase 0 points 10 points  Total Score 0 points 10 points    Immunizations Immunization History  Administered Date(s) Administered   Fluad Quad(high Dose 65+) 07/07/2021   Influenza, High Dose Seasonal PF 07/24/2017, 07/31/2019, 08/04/2020   Influenza,inj,quad, With Preservative 07/27/2016, 07/31/2018   Moderna Sars-Covid-2 Vaccination 01/17/2020, 02/14/2020, 10/21/2020    Pneumococcal Conjugate-13 07/27/2016   Pneumococcal Polysaccharide-23 07/24/2017   Zoster Recombinat (Shingrix) 03/23/2021, 06/04/2021    TDAP status: Due, Education has been provided regarding the importance of this vaccine. Advised may receive this vaccine at local pharmacy or Health Dept. Aware to provide a copy of the vaccination record if obtained from local pharmacy or Health Dept. Verbalized acceptance and understanding.  Flu Vaccine status: Due, Education has been provided regarding the importance of this vaccine. Advised may receive this vaccine at local pharmacy or Health Dept. Aware to provide a copy of the vaccination record if obtained from local pharmacy or Health Dept. Verbalized acceptance and understanding.  Pneumococcal vaccine status: Up to date  Covid-19 vaccine status: Completed vaccines  Qualifies for Shingles Vaccine? Yes   Zostavax completed No   Shingrix Completed?: Yes  Screening Tests Health Maintenance  Topic Date Due   TETANUS/TDAP  Never done   COLONOSCOPY (Pts 45-43yr Insurance coverage will need to be confirmed)  Never done   COVID-19 Vaccine (4 - Moderna risk series) 12/16/2020   INFLUENZA VACCINE  05/31/2022   MAMMOGRAM  05/20/2024   Pneumonia Vaccine 71 Years old  Completed   DEXA SCAN  Completed   Hepatitis C Screening  Completed   Zoster Vaccines- Shingrix  Completed   HPV VACCINES  Aged Out    Health Maintenance  Health Maintenance Due  Topic Date Due   TETANUS/TDAP  Never done   COLONOSCOPY (Pts 45-451yrInsurance coverage will need to be confirmed)  Never done   COVID-19 Vaccine (4 - Moderna risk series) 12/16/2020   INFLUENZA VACCINE  05/31/2022    [facility] and will try to get records to see when next due   Mammogram status: Completed  2023. Repeat every year  Bone Density status: Completed 2022. Results reflect: Bone density results: OSTEOPOROSIS. Repeat every 2 years.  Lung Cancer  Screening: (Low Dose CT Chest recommended if Age 61-80 years, 30 pack-year currently smoking OR have quit w/in 15years.) does not qualify.   Lung Cancer Screening Referral: na  Additional Screening:  Hepatitis C Screening: does qualify; Completed   Vision Screening: Recommended annual ophthalmology exams for early detection of glaucoma and other disorders of the eye. Is the patient up to date with their annual eye exam?  Yes  Who is the provider or what is the name of the office in which the patient attends annual eye exams?  If pt is not established with a provider, would they like to be referred to a provider to establish care? No .   Dental Screening: Recommended annual dental exams for proper oral hygiene  Community Resource Referral / Chronic Care Management: CRR required this visit?  No   CCM required this visit?  No      Plan:     I have personally reviewed and noted the following in the patient's chart:   Medical and social history Use of alcohol, tobacco or illicit drugs  Current medications and supplements including opioid prescriptions. Patient is not currently taking opioid prescriptions. Functional ability and status Nutritional status Physical activity Advanced directives List of other physicians Hospitalizations, surgeries, and ER visits in previous 12 months Vitals Screenings to include cognitive, depression, and falls Referrals and appointments  In addition, I have reviewed and discussed with patient certain preventive protocols, quality metrics, and best practice recommendations. A written personalized care plan for preventive services as well as general preventive health recommendations were provided to patient.     Eual Fines, LPN   12/26/3333   Nurse Notes: Schedule your next wellness visit for 1 year at checkout

## 2022-07-11 NOTE — Patient Instructions (Addendum)
  Ms. Houpt , Thank you for taking time to come for your Medicare Wellness Visit. I appreciate your ongoing commitment to your health goals. Please review the following plan we discussed and let me know if I can assist you in the future.   We will get your past colonoscopy results so we can update your record.   You can get your flu vaccine at your Oct appt.   These are the goals we discussed:  Goals      Prevent falls        This is a list of the screening recommended for you and due dates:  Health Maintenance  Topic Date Due   Tetanus Vaccine  Never done   Colon Cancer Screening  Never done   COVID-19 Vaccine (4 - Moderna risk series) 12/16/2020   Flu Shot  05/31/2022   Mammogram  05/20/2024   Pneumonia Vaccine  Completed   DEXA scan (bone density measurement)  Completed   Hepatitis C Screening: USPSTF Recommendation to screen - Ages 67-79 yo.  Completed   Zoster (Shingles) Vaccine  Completed   HPV Vaccine  Aged Out

## 2022-07-19 DIAGNOSIS — M5414 Radiculopathy, thoracic region: Secondary | ICD-10-CM | POA: Diagnosis not present

## 2022-07-19 DIAGNOSIS — M25562 Pain in left knee: Secondary | ICD-10-CM | POA: Insufficient documentation

## 2022-07-19 DIAGNOSIS — M5136 Other intervertebral disc degeneration, lumbar region: Secondary | ICD-10-CM | POA: Insufficient documentation

## 2022-07-19 DIAGNOSIS — M47894 Other spondylosis, thoracic region: Secondary | ICD-10-CM | POA: Insufficient documentation

## 2022-07-19 DIAGNOSIS — G588 Other specified mononeuropathies: Secondary | ICD-10-CM | POA: Diagnosis not present

## 2022-07-19 DIAGNOSIS — M25561 Pain in right knee: Secondary | ICD-10-CM | POA: Diagnosis not present

## 2022-07-26 DIAGNOSIS — M549 Dorsalgia, unspecified: Secondary | ICD-10-CM | POA: Diagnosis not present

## 2022-08-01 ENCOUNTER — Encounter: Payer: Self-pay | Admitting: *Deleted

## 2022-08-02 ENCOUNTER — Encounter: Payer: Self-pay | Admitting: Internal Medicine

## 2022-08-02 ENCOUNTER — Ambulatory Visit (INDEPENDENT_AMBULATORY_CARE_PROVIDER_SITE_OTHER): Payer: PPO | Admitting: Internal Medicine

## 2022-08-02 VITALS — BP 118/76 | HR 74 | Resp 16 | Ht 67.0 in | Wt 125.3 lb

## 2022-08-02 DIAGNOSIS — Z23 Encounter for immunization: Secondary | ICD-10-CM | POA: Diagnosis not present

## 2022-08-02 DIAGNOSIS — M47894 Other spondylosis, thoracic region: Secondary | ICD-10-CM

## 2022-08-02 DIAGNOSIS — G4700 Insomnia, unspecified: Secondary | ICD-10-CM | POA: Diagnosis not present

## 2022-08-02 DIAGNOSIS — F411 Generalized anxiety disorder: Secondary | ICD-10-CM | POA: Diagnosis not present

## 2022-08-02 DIAGNOSIS — E039 Hypothyroidism, unspecified: Secondary | ICD-10-CM

## 2022-08-02 DIAGNOSIS — M810 Age-related osteoporosis without current pathological fracture: Secondary | ICD-10-CM | POA: Diagnosis not present

## 2022-08-02 DIAGNOSIS — Z1159 Encounter for screening for other viral diseases: Secondary | ICD-10-CM | POA: Diagnosis not present

## 2022-08-02 DIAGNOSIS — G588 Other specified mononeuropathies: Secondary | ICD-10-CM | POA: Insufficient documentation

## 2022-08-02 MED ORDER — ALPRAZOLAM 1 MG PO TABS: 1.0000 mg | ORAL_TABLET | Freq: Every evening | ORAL | 1 refills | Status: DC | PRN

## 2022-08-02 NOTE — Patient Instructions (Signed)
Please continue taking medications as prescribed.  Please continue to follow up with pain specialist as scheduled.

## 2022-08-02 NOTE — Assessment & Plan Note (Signed)
Chronic left-sided chest wall pain, could be radiating from thoracic spine Followed by [facility] pain clinic 

## 2022-08-02 NOTE — Assessment & Plan Note (Signed)
Well-controlled with Xanax 1 mg PRN, takes it BID sometimes Refilled after reviewing PDMP today 

## 2022-08-02 NOTE — Assessment & Plan Note (Signed)
Lab Results  Component Value Date   TSH 0.097 (L) 05/05/2022   On Levothyroxine 50 mcg QD, dose was decreased after checking TSH Recheck TSH and free T4

## 2022-08-02 NOTE — Progress Notes (Signed)
Established Patient Office Visit  Subjective:  Patient ID: Ruth Jennings, female    DOB: [birth year]  Age: 71 y.o. MRN: [id number]  CC:  Chief Complaint  Patient presents with   Follow-up    Follow up pt still going to pain management and therapy pain is about the same    HPI Ruth Jennings is a 71 y.o. female with past medical history of hypothyroidism, CKD stage 3a, anxiety and HLD who presents for f/u of her chronic medical conditions.  Hypothyroidism: Takes Levothyroxine 50 mcg QD.  Does was recently decreased due to low TSH. Denies any constipation, diarrhea, recent change in appetite or weight.   Anxiety: Well-controlled with Xanax PRN. Sleep is affected by chest wall pain. Denies any anhedonia, SI or HI.  Left sided chest wall pain: Chronic, intermittent, worse at night radiating from LLQ abdomen and thoracic spine area. Associated with palpitations. Has tried Cymbalta, Gabapentin and Lyrica. Has had CT chest and Cardiology evaluation. She was referred to pain clinic in [place], and has had intercostal nerve block X 1.  She has tried physical therapy with minimal relief as well.  She is going to get nerve block again in the next week.   Past Medical History:  Diagnosis Date   Anxiety    Hyperlipidemia    Hypothyroidism    Posttraumatic stress disorder 03/01/2013    Past Surgical History:  Procedure Laterality Date   No prior surgery      Family History  Problem Relation Age of Onset   COPD Father     Social History   Socioeconomic History   Marital status: Married    Spouse name: Not on file   Number of children: 1   Years of education: HS   Highest education level: Not on file  Occupational History   Occupation: Pharmacologist: OTHER    Comment: OWN/EMPLOYED AT [employer]  Tobacco Use   Smoking status: Never   Smokeless tobacco: Never  Vaping Use   Vaping Use: Never used  Substance and Sexual Activity   Alcohol use: No   Drug  use: No   Sexual activity: Not Currently  Other Topics Concern   Not on file  Social History Narrative   Not on file   Social Determinants of Health   Financial Resource Strain: Low Risk  (07/11/2022)   Overall Financial Resource Strain (CARDIA)    Difficulty of Paying Living Expenses: Not hard at all  Food Insecurity: No Food Insecurity (07/06/2021)   Hunger Vital Sign    Worried About Running Out of Food in the Last Year: Never true    Ran Out of Food in the Last Year: Never true  Transportation Needs: No Transportation Needs (07/11/2022)   PRAPARE - Administrator, Civil Service (Medical): No    Lack of Transportation (Non-Medical): No  Physical Activity: Sufficiently Active (07/11/2022)   Exercise Vital Sign    Days of Exercise per Week: 6 days    Minutes of Exercise per Session: 30 min  Stress: No Stress Concern Present (07/06/2021)   [employer] of Occupational Health - Occupational Stress Questionnaire    Feeling of Stress : Not at all  Social Connections: Moderately Integrated (07/11/2022)   Social Connection and Isolation Panel [NHANES]    Frequency of Communication with Friends and Family: More than three times a week    Frequency of Social Gatherings with Friends and Family: More than three times a  week    Attends Religious Services: 1 to 4 times per year    Active Member of Clubs or Organizations: No    Attends Archivist Meetings: Never    Marital Status: Married  Human resources officer Violence: Not At Risk (07/06/2021)   Humiliation, Afraid, Rape, and Kick questionnaire    Fear of Current or Ex-Partner: No    Emotionally Abused: No    Physically Abused: No    Sexually Abused: No    Outpatient Medications Prior to Visit  Medication Sig Dispense Refill   alendronate (FOSAMAX) 70 MG tablet TAKE 1 TABLET EVERY WEEK IN THE MORNING 30 MIN BEFORE EATING WITH AN 8OZ GLASS OF WATER (SIT UP 30 MIN) 12 tablet 3   Cholecalciferol 25 MCG (1000 UT) tablet  Take 2 tablets by mouth daily.     cyanocobalamin 1000 MCG tablet Take by mouth.     cyclobenzaprine (FLEXERIL) 5 MG tablet Take 1 tablet (5 mg total) by mouth 3 (three) times daily as needed for muscle spasms. 30 tablet 1   levothyroxine (SYNTHROID) 50 MCG tablet Take 1 tablet (50 mcg total) by mouth daily before breakfast. 90 tablet 0   omeprazole (PRILOSEC) 20 MG capsule Take 1 capsule (20 mg total) by mouth daily. 30 capsule 3   pravastatin (PRAVACHOL) 40 MG tablet Take 1 tablet (40 mg total) by mouth daily. 90 tablet 3   pregabalin (LYRICA) 75 MG capsule Take 1 capsule (75 mg total) by mouth 2 (two) times daily. 60 capsule 2   ALPRAZolam (XANAX) 1 MG tablet Take 1 tablet (1 mg total) by mouth 2 (two) times daily as needed for anxiety. 60 tablet 2   No facility-administered medications prior to visit.    Allergies  Allergen Reactions   Prednisone Other (See Comments)    Caused pain to be worse.    ROS Review of Systems  Constitutional:  Negative for chills and fever.  HENT:  Negative for congestion, sinus pressure, sinus pain and sore throat.   Eyes:  Negative for pain and discharge.  Respiratory:  Negative for cough and shortness of breath.        Chest wall pain  Cardiovascular:  Positive for palpitations. Negative for leg swelling.  Gastrointestinal:  Negative for diarrhea, nausea and vomiting.  Endocrine: Negative for polydipsia and polyuria.  Genitourinary:  Negative for dysuria and hematuria.  Musculoskeletal:  Negative for neck pain and neck stiffness.  Skin:  Negative for rash.  Neurological:  Negative for dizziness and weakness.  Psychiatric/Behavioral:  Negative for agitation and behavioral problems.       Objective:    Physical Exam Vitals reviewed.  Constitutional:      General: She is not in acute distress.    Appearance: She is not diaphoretic.  HENT:     Head: Normocephalic and atraumatic.     Nose: Nose normal.     Mouth/Throat:     Mouth: Mucous  membranes are moist.  Eyes:     General: No scleral icterus.    Extraocular Movements: Extraocular movements intact.  Cardiovascular:     Rate and Rhythm: Normal rate and regular rhythm.     Pulses: Normal pulses.     Heart sounds: Normal heart sounds. No murmur heard. Pulmonary:     Breath sounds: Normal breath sounds. No wheezing or rales.  Musculoskeletal:     Cervical back: Neck supple. No tenderness.     Thoracic back: Tenderness present. Decreased range of motion (Due to  pain).     Right lower leg: No edema.     Left lower leg: No edema.  Skin:    General: Skin is warm.     Findings: No rash.  Neurological:     General: No focal deficit present.     Mental Status: She is alert and oriented to person, place, and time.     Sensory: No sensory deficit.     Motor: No weakness.  Psychiatric:        Mood and Affect: Mood normal.        Behavior: Behavior normal.     BP 118/76 (BP Location: Left Arm, Patient Position: Sitting, Cuff Size: Normal)   Pulse 74   Resp 16   Ht $R'5\' 7"'tW$  (1.702 m)   Wt 125 lb 4.8 oz (56.8 kg)   SpO2 98%   BMI 19.62 kg/m  Wt Readings from Last 3 Encounters:  08/02/22 125 lb 4.8 oz (56.8 kg)  05/05/22 122 lb (55.3 kg)  12/29/21 123 lb (55.8 kg)    Lab Results  Component Value Date   TSH 0.097 (L) 05/05/2022   Lab Results  Component Value Date   WBC 5.6 06/01/2021   HGB 13.9 06/01/2021   HCT 42.3 06/01/2021   MCV 90 06/01/2021   PLT 195 06/01/2021   Lab Results  Component Value Date   NA 142 12/29/2021   K 3.7 12/29/2021   CO2 21 12/29/2021   GLUCOSE 92 12/29/2021   BUN 17 12/29/2021   CREATININE 0.97 12/29/2021   BILITOT 0.4 06/01/2021   ALKPHOS 90 06/01/2021   AST 17 06/01/2021   ALT 14 06/01/2021   PROT 6.8 06/01/2021   ALBUMIN 4.3 06/01/2021   CALCIUM 9.3 12/29/2021   EGFR 63 12/29/2021   Lab Results  Component Value Date   CHOL 184 12/29/2021   Lab Results  Component Value Date   HDL 70 12/29/2021   Lab Results   Component Value Date   LDLCALC 94 12/29/2021   Lab Results  Component Value Date   TRIG 116 12/29/2021   Lab Results  Component Value Date   CHOLHDL 2.6 12/29/2021   No results found for: "HGBA1C"    Assessment & Plan:   Problem List Items Addressed This Visit       Endocrine   Acquired hypothyroidism - Primary    Lab Results  Component Value Date   TSH 0.097 (L) 05/05/2022  On Levothyroxine 50 mcg QD, dose was decreased after checking TSH Recheck TSH and free T4      Relevant Orders   US THYROID     Musculoskeletal and Integument   Osteoporosis without current pathological fracture    On Alendronate Continue calcium and Vitamin D supplements        Other   Insomnia   Relevant Medications   ALPRAZolam (XANAX) 1 MG tablet   GAD (generalized anxiety disorder)    Well-controlled with Xanax 1 mg PRN, takes it BID sometimes Refilled after reviewing PDMP today      Relevant Medications   ALPRAZolam (XANAX) 1 MG tablet   Thoracic facet syndrome    Chronic left-sided chest wall pain, could be radiating from thoracic spine Followed by [facility]      Intercostal neuralgia    Intermittent, appears to radiating from LLQ to anterior axillary border Does not seem to cardiac related pain Unclear etiology Previous imaging studies were insignificant including CT chest Has tried Cymbalta, Gabapentin and Lyrica in  the past Referred to pain clinic -last visit note reviewed, has had intercoastal nerve block and PT On Lyrica 75 mg TID now      Other Visit Diagnoses     Need for immunization against influenza       Relevant Orders   Flu Vaccine QUAD High Dose(Fluad) (Completed)       Meds ordered this encounter  Medications   ALPRAZolam (XANAX) 1 MG tablet    Sig: Take 1 tablet (1 mg total) by mouth at bedtime as needed for anxiety.    Dispense:  60 tablet    Refill:  1    Follow-up: Return in about 4 months (around 12/03/2022) for  Hypothyroidism and GAD.    Lindell Spar, MD

## 2022-08-02 NOTE — Assessment & Plan Note (Addendum)
On Alendronate Continue calcium and Vitamin D supplements 

## 2022-08-02 NOTE — Assessment & Plan Note (Signed)
Intermittent, appears to radiating from LLQ to anterior axillary border Does not seem to cardiac related pain Unclear etiology Previous imaging studies were insignificant including CT chest Has tried Cymbalta, Gabapentin and Lyrica in the past Referred to pain clinic -last visit note reviewed, has had intercoastal nerve block and PT On Lyrica 75 mg TID now

## 2022-08-03 ENCOUNTER — Other Ambulatory Visit: Payer: Self-pay | Admitting: Internal Medicine

## 2022-08-03 DIAGNOSIS — E039 Hypothyroidism, unspecified: Secondary | ICD-10-CM

## 2022-08-03 LAB — BASIC METABOLIC PANEL
BUN/Creatinine Ratio: 16 (ref 12–28)
BUN: 16 mg/dL (ref 8–27)
CO2: 28 mmol/L (ref 20–29)
Calcium: 9.5 mg/dL (ref 8.7–10.3)
Chloride: 99 mmol/L (ref 96–106)
Creatinine, Ser: 1 mg/dL (ref 0.57–1.00)
Glucose: 99 mg/dL (ref 70–99)
Potassium: 4.7 mmol/L (ref 3.5–5.2)
Sodium: 139 mmol/L (ref 134–144)
eGFR: 60 mL/min/{1.73_m2} (ref >59)

## 2022-08-03 LAB — TSH+FREE T4
Free T4: 1.08 ng/dL (ref 0.82–1.77)
TSH: 11.3 u[IU]/mL — ABNORMAL HIGH (ref 0.450–4.500)

## 2022-08-03 MED ORDER — LEVOTHYROXINE SODIUM 75 MCG PO TABS: 75.0000 ug | ORAL_TABLET | Freq: Every day | ORAL | 3 refills | Status: DC

## 2022-08-04 LAB — HEPATITIS C ANTIBODY: Hep C Virus Ab: NONREACTIVE

## 2022-08-06 LAB — SPECIMEN STATUS REPORT

## 2022-08-06 LAB — HEPATITIS C ANTIBODY: Hep C Virus Ab: NONREACTIVE

## 2022-08-10 ENCOUNTER — Ambulatory Visit (HOSPITAL_COMMUNITY)
Admission: RE | Admit: 2022-08-10 | Discharge: 2022-08-10 | Disposition: A | Discharge status: Home / Self Care | Payer: PPO | Source: Ambulatory Visit | Attending: Internal Medicine | Admitting: Internal Medicine

## 2022-08-10 DIAGNOSIS — E039 Hypothyroidism, unspecified: Secondary | ICD-10-CM | POA: Diagnosis not present

## 2022-08-19 DIAGNOSIS — G588 Other specified mononeuropathies: Secondary | ICD-10-CM | POA: Diagnosis not present

## 2022-08-26 DIAGNOSIS — M5414 Radiculopathy, thoracic region: Secondary | ICD-10-CM | POA: Diagnosis not present

## 2022-08-26 DIAGNOSIS — G588 Other specified mononeuropathies: Secondary | ICD-10-CM | POA: Diagnosis not present

## 2022-08-26 DIAGNOSIS — R531 Weakness: Secondary | ICD-10-CM | POA: Diagnosis not present

## 2022-08-26 DIAGNOSIS — R110 Nausea: Secondary | ICD-10-CM | POA: Diagnosis not present

## 2022-08-26 DIAGNOSIS — R6881 Early satiety: Secondary | ICD-10-CM | POA: Diagnosis not present

## 2022-09-30 DIAGNOSIS — M47894 Other spondylosis, thoracic region: Secondary | ICD-10-CM | POA: Diagnosis not present

## 2022-09-30 DIAGNOSIS — M5414 Radiculopathy, thoracic region: Secondary | ICD-10-CM | POA: Diagnosis not present

## 2022-10-04 ENCOUNTER — Other Ambulatory Visit: Payer: Self-pay | Admitting: Internal Medicine

## 2022-10-04 DIAGNOSIS — E782 Mixed hyperlipidemia: Secondary | ICD-10-CM

## 2022-10-17 ENCOUNTER — Ambulatory Visit: Payer: PPO | Admitting: Gastroenterology

## 2022-10-19 DIAGNOSIS — M47814 Spondylosis without myelopathy or radiculopathy, thoracic region: Secondary | ICD-10-CM | POA: Diagnosis not present

## 2022-11-07 DIAGNOSIS — M47816 Spondylosis without myelopathy or radiculopathy, lumbar region: Secondary | ICD-10-CM | POA: Diagnosis not present

## 2022-11-25 DIAGNOSIS — M47894 Other spondylosis, thoracic region: Secondary | ICD-10-CM | POA: Diagnosis not present

## 2022-11-25 DIAGNOSIS — M791 Myalgia, unspecified site: Secondary | ICD-10-CM | POA: Diagnosis not present

## 2022-11-27 NOTE — Progress Notes (Unsigned)
GI Office Note    Referring Provider: Lindell Spar, MD Primary Care Physician:  Lindell Spar, MD  Primary Gastroenterologist: Cristopher Estimable.Rourk, MD, previously Dr. Benson Norway  Chief Complaint   Chief Complaint  Patient presents with   New Patient (Initial Visit)    Pt referred for intercostal neuralgia    History of Present Illness   Ruth Jennings is a 72 y.o. female presenting today at the request of Lindell Spar, MD for  nausea, weakness, early satiety.  Colonoscopy by Dr. Benson Norway August 2020: -sigmoid diverticulosis -repeat colonoscopy in 7 years as previous colonoscopy in 2017 with 5 adenomas.   Last office visit with Dr. Benson Norway 09/28/20. Pleurodynia, diarrhea, and diverticulosis. No visit notes available for review.   Per referral paperwork patient underwent intercostal nerve block in October 2023 for intercostal neuralgia. In October 2023 patient reported feeling fulness in her abdomen and pain in the center of her rib cage. Pain causing fatigue and is worse with bleeding, lifting, standing and unable to do household chores.   11/25/22 she reported 7-% improvement of pain. Patient complained of nausea, weakness, early satiety.   Labs 08/02/22: TSH 11, T4 1.08. BMP wnl. HCV ab negative.   Today:  C/o abdominal fullness and nausea for about 6 months. Pain starts in her left side and has pain mid epigastric region that wakes her up about every other day around 2-3 AM. Intense burning sensation. Also was having a lot of burping and gas. States the pantoprazole has helped the gas and gurgling but not the pain. Stopped taking it after 20 days or so. Just nausea, no vomiting. Has had some dry heaves. Denies typical indigestion or reflux symptoms. When she eats she is unable to take very little bites and has a fullness feeling. Has been having a little trouble with solids. Does not happen very often  and had gotten gagged a few times. No unintentional weight loss. Has midline pain during  the day. Avoids fried foods. Only 1 cup of coffee daily. Avoids NSAIDs.   Does have constipation but takes metamucil and helps keep her a little more regular. Does have to strain a little and when this occurs it affects her back muscle. Has not tried stool softener or miralax. No melena or brbpr.   Last colonoscopy in 2020. First colonoscopy with 7 polyps and repeat in 3 years and had more polyps then another in 3 years. Mother had colon cancer.  Has never had an upper endoscopy.   Has been seeing chronic pain and receiving injections   Current Outpatient Medications  Medication Sig Dispense Refill   alendronate (FOSAMAX) 70 MG tablet TAKE 1 TABLET EVERY WEEK IN THE MORNING 30 MIN BEFORE EATING WITH AN 8OZ GLASS OF WATER (SIT UP 30 MIN) 12 tablet 3   ALPRAZolam (XANAX) 1 MG tablet Take 1 tablet (1 mg total) by mouth at bedtime as needed for anxiety. 60 tablet 1   Cholecalciferol 25 MCG (1000 UT) tablet Take 2 tablets by mouth daily.     cyanocobalamin 1000 MCG tablet Take by mouth.     levothyroxine (SYNTHROID) 75 MCG tablet Take 1 tablet (75 mcg total) by mouth daily before breakfast. 30 tablet 3   pantoprazole (PROTONIX) 40 MG tablet Take 1 tablet (40 mg total) by mouth daily before breakfast. 90 tablet 3   pravastatin (PRAVACHOL) 40 MG tablet TAKE 1 TABLET BY MOUTH DAILY. 90 tablet 0   cyclobenzaprine (FLEXERIL) 5 MG tablet Take  1 tablet (5 mg total) by mouth 3 (three) times daily as needed for muscle spasms. (Patient not taking: Reported on 11/28/2022) 30 tablet 1   pregabalin (LYRICA) 75 MG capsule Take 1 capsule (75 mg total) by mouth 2 (two) times daily. (Patient not taking: Reported on 11/28/2022) 60 capsule 2   No current facility-administered medications for this visit.    Past Medical History:  Diagnosis Date   Anxiety    Hyperlipidemia    Hypothyroidism    Posttraumatic stress disorder 03/01/2013    Past Surgical History:  Procedure Laterality Date   No prior surgery       Family History  Problem Relation Age of Onset   Colon cancer Mother    COPD Father     Allergies as of 11/28/2022 - Review Complete 11/28/2022  Allergen Reaction Noted   Prednisone Other (See Comments) 01/12/2021    Social History   Socioeconomic History   Marital status: Married    Spouse name: Not on file   Number of children: 1   Years of education: HS   Highest education level: Not on file  Occupational History   Occupation: Occupational hygienist: OTHER    Comment: OWN/EMPLOYED AT [employer]  Tobacco Use   Smoking status: Never   Smokeless tobacco: Never  Vaping Use   Vaping Use: Never used  Substance and Sexual Activity   Alcohol use: No   Drug use: No   Sexual activity: Not Currently  Other Topics Concern   Not on file  Social History Narrative   Not on file   Social Determinants of Health   Financial Resource Strain: Low Risk  (07/11/2022)   Overall Financial Resource Strain (CARDIA)    Difficulty of Paying Living Expenses: Not hard at all  Food Insecurity: No Food Insecurity (07/06/2021)   Hunger Vital Sign    Worried About Running Out of Food in the Last Year: Never true    [place] in the Last Year: Never true  Transportation Needs: No Transportation Needs (07/11/2022)   PRAPARE - Hydrologist (Medical): No    Lack of Transportation (Non-Medical): No  Physical Activity: Sufficiently Active (07/11/2022)   Exercise Vital Sign    Days of Exercise per Week: 6 days    Minutes of Exercise per Session: 30 min  Stress: No Stress Concern Present (07/06/2021)   [place]    Feeling of Stress : Not at all  Social Connections: Moderately Integrated (07/11/2022)   Social Connection and Isolation Panel [NHANES]    Frequency of Communication with Friends and Family: More than three times a week    Frequency of Social Gatherings with Friends and  Family: More than three times a week    Attends Religious Services: 1 to 4 times per year    Active Member of [employer] or Organizations: No    Attends Archivist Meetings: Never    Marital Status: Married  Human resources officer Violence: Not At Risk (07/06/2021)   Humiliation, Afraid, Rape, and Kick questionnaire    Fear of Current or Ex-Partner: No    Emotionally Abused: No    Physically Abused: No    Sexually Abused: No     Review of Systems   Gen: Denies any fever, chills, fatigue, weight loss, lack of appetite.  CV: Denies chest pain, heart palpitations, peripheral edema, syncope.  Resp: Denies shortness of breath  at rest or with exertion. Denies wheezing or cough.  GI: see HPI GU : Denies urinary burning, urinary frequency, urinary hesitancy MS: Denies joint pain, muscle weakness, cramps, or limitation of movement.  Derm: Denies rash, itching, dry skin Psych: Denies depression, anxiety, memory loss, and confusion Heme: Denies bruising, bleeding, and enlarged lymph nodes.   Physical Exam   BP 124/66   Pulse 66   Temp 97.7 F (36.5 C)   Ht '5\' 7"'$  (1.702 m)   Wt 119 lb 3.2 oz (54.1 kg)   BMI 18.67 kg/m   General:   Alert and oriented. Pleasant and cooperative. Well-nourished and well-developed.  Head:  Normocephalic and atraumatic. Eyes:  Without icterus, sclera clear and conjunctiva pink.  Ears:  Normal auditory acuity. Mouth:  No deformity or lesions, oral mucosa pink.  Lungs:  Clear to auscultation bilaterally. No wheezes, rales, or rhonchi. No distress.  Heart:  S1, S2 present without murmurs appreciated.  Abdomen:  +BS, soft, non-distended. TTP to LUQ and epigastrium. Mild ttp to LLQ. No HSM noted. No guarding or rebound. No masses appreciated.  Rectal:  Deferred Msk:  Symmetrical without gross deformities. Normal posture. Extremities:  Without edema. Neurologic:  Alert and  oriented x4;  grossly normal neurologically. Skin:  Intact without significant lesions  or rashes. Psych:  Alert and cooperative. Normal mood and affect.   Assessment   Ruth Jennings is a 72 y.o. female with a history of anxiety, HLD, hypothyroidism, PTSD, intercostal neuralgia presenting for evaluation of nausea, early satiety; also with mild constipation  GERD, Nausea, early satiety, abdominal pain: Denies frequent NSAID use.  Also denies any typical indigestion or reflux symptoms but has been having occasional nausea with dry heaving without vomiting.  Biggest complaint is epigastric pain that occurs during the day as well as pain that begins in her left side and radiates to midline that occurs in the melanite associated with an intense burning sensation.  Also with some early satiety and feeling full frequently.  Reports her weight has been stable.  Typically avoids any fried foods and only has 1 cup of coffee daily.  Also with some vague intermittent solid food dysphagia.  Previously tried omeprazole which helped with frequent belching however did not help her pain.  It appears she was taking only omeprazole 20 mg once daily for about 3 weeks.  We discussed GERD and gastritis in detail today and advised that her symptoms are fairly consistent with this therefore I encouraged her to take pantoprazole 40 mg daily, 30 minutes prior to breakfast and may add famotidine 20 mg nightly if needed.  If symptoms continue we discussed performing upper endoscopy for further evaluation and she is in agreement.  Constipation: Has occasional constipation with very mild straining at times however has been taking Metamucil daily which has been helping with this.  States that typically her straining irritates her back pain.  Family history of colon cancer, personal history of colon polyps: Last colonoscopy in 2020 with sigmoid diverticulosis and no polyps.  Does report history of 5 polyps in 2017 and 7 polyps in 2014.  Her mother had colon cancer.  Per her last colonoscopy in 2020 with Dr. Benson Norway he stated  she would be due for surveillance in 7 years.   PLAN   Pantoprazole 40 mg daily.  Famotidine 20 mg nightly.  Call with progress report in 1-2 weeks.  GERD diet/lifestyle modifications.  Continue metamucil daily May use stool softener and miralax as needed Colonoscopy  in 2027. Follow up in 3 months, sooner if needed.    Venetia Night, MSN, FNP-BC, AGACNP-BC [facility]

## 2022-11-28 ENCOUNTER — Ambulatory Visit (INDEPENDENT_AMBULATORY_CARE_PROVIDER_SITE_OTHER): Payer: PPO | Admitting: Gastroenterology

## 2022-11-28 ENCOUNTER — Encounter: Payer: Self-pay | Admitting: Gastroenterology

## 2022-11-28 VITALS — BP 124/66 | HR 66 | Temp 97.7°F | Ht 67.0 in | Wt 119.2 lb

## 2022-11-28 DIAGNOSIS — R6881 Early satiety: Secondary | ICD-10-CM | POA: Diagnosis not present

## 2022-11-28 DIAGNOSIS — R1013 Epigastric pain: Secondary | ICD-10-CM | POA: Diagnosis not present

## 2022-11-28 DIAGNOSIS — R112 Nausea with vomiting, unspecified: Secondary | ICD-10-CM

## 2022-11-28 DIAGNOSIS — K5900 Constipation, unspecified: Secondary | ICD-10-CM

## 2022-11-28 DIAGNOSIS — K219 Gastro-esophageal reflux disease without esophagitis: Secondary | ICD-10-CM | POA: Diagnosis not present

## 2022-11-28 MED ORDER — PANTOPRAZOLE SODIUM 40 MG PO TBEC: 40.0000 mg | DELAYED_RELEASE_TABLET | Freq: Every day | ORAL | 3 refills | Status: DC

## 2022-11-28 NOTE — Patient Instructions (Addendum)
As we discussed if you like your symptoms are related to possible gastritis or atypical acid reflux therefore I encourage you to start pantoprazole 40 mg once daily, 30 minutes prior to breakfast.  I would also like for you to take famotidine 20 mg nightly if you continue to have pain after a few weeks.  This will hopefully help with any of your nighttime symptoms.  Follow a GERD diet:  Avoid fried, fatty, greasy, spicy, citrus foods. Avoid caffeine and carbonated beverages. Avoid chocolate. Try eating 4-6 small meals a day rather than 3 large meals. Do not eat within 3 hours of laying down. Prop head of bed up on wood or bricks to create a 6 inch incline.  Continue Metamucil daily.  If you need to for any worsening straining or any hard stools you may take a daily stool softener or begin taking 1 capful of MiraLAX daily.  We will plan to follow-up in 3 months, or sooner if needed.  Please not hesitate to call me with a progress report in about 2 weeks to let me know how things are going.  If you use MyChart you could also send a MyChart message.  It was a pleasure to see you today. I want to create trusting relationships with patients. If you receive a survey regarding your visit,  I greatly appreciate you taking time to fill this out on paper or through your MyChart. I value your feedback.  Venetia Night, MSN, FNP-BC, AGACNP-BC [facility]

## 2022-12-05 ENCOUNTER — Encounter: Payer: Self-pay | Admitting: Internal Medicine

## 2022-12-05 ENCOUNTER — Ambulatory Visit (INDEPENDENT_AMBULATORY_CARE_PROVIDER_SITE_OTHER): Payer: PPO | Admitting: Internal Medicine

## 2022-12-05 VITALS — BP 115/72 | HR 73 | Ht 67.0 in | Wt 119.2 lb

## 2022-12-05 DIAGNOSIS — G4700 Insomnia, unspecified: Secondary | ICD-10-CM | POA: Diagnosis not present

## 2022-12-05 DIAGNOSIS — F411 Generalized anxiety disorder: Secondary | ICD-10-CM | POA: Diagnosis not present

## 2022-12-05 DIAGNOSIS — G588 Other specified mononeuropathies: Secondary | ICD-10-CM | POA: Diagnosis not present

## 2022-12-05 DIAGNOSIS — E782 Mixed hyperlipidemia: Secondary | ICD-10-CM | POA: Diagnosis not present

## 2022-12-05 DIAGNOSIS — M47894 Other spondylosis, thoracic region: Secondary | ICD-10-CM

## 2022-12-05 DIAGNOSIS — E039 Hypothyroidism, unspecified: Secondary | ICD-10-CM | POA: Diagnosis not present

## 2022-12-05 DIAGNOSIS — M810 Age-related osteoporosis without current pathological fracture: Secondary | ICD-10-CM | POA: Diagnosis not present

## 2022-12-05 MED ORDER — ALENDRONATE SODIUM 70 MG PO TABS: ORAL_TABLET | ORAL | 3 refills | Status: DC

## 2022-12-05 MED ORDER — ALPRAZOLAM 1 MG PO TABS: 1.0000 mg | ORAL_TABLET | Freq: Every evening | ORAL | 1 refills | Status: DC | PRN

## 2022-12-05 MED ORDER — PRAVASTATIN SODIUM 40 MG PO TABS: 40.0000 mg | ORAL_TABLET | Freq: Every day | ORAL | 3 refills | Status: DC

## 2022-12-05 NOTE — Assessment & Plan Note (Signed)
Chronic left-sided chest wall pain, could be radiating from thoracic spine Followed by [facility]

## 2022-12-05 NOTE — Assessment & Plan Note (Signed)
On statin Check lipid profile 

## 2022-12-05 NOTE — Patient Instructions (Signed)
Please continue taking medications as prescribed.  Please continue to follow low salt diet and ambulate as tolerated.  Please consider getting Tdap vaccine.

## 2022-12-05 NOTE — Assessment & Plan Note (Signed)
On Alendronate Continue calcium and Vitamin D supplements

## 2022-12-05 NOTE — Progress Notes (Signed)
Established Patient Office Visit  Subjective:  Patient ID: Ruth Jennings, female    DOB: 06/03/1951  Age: 72 y.o. MRN: [id number]  CC:  Chief Complaint  Patient presents with   Hypothyroidism    Four month follow up    HPI Ruth Jennings is a 72 y.o. female with past medical history of hypothyroidism, CKD stage 3a, anxiety and HLD who presents for f/u of her chronic medical conditions.  Hypothyroidism: Takes Levothyroxine 75 mcg QD.  Dose was recently increased due to elevated TSH.  She has chronic hair loss concern. Denies any constipation, diarrhea, recent change in appetite or weight.  Anxiety: Well-controlled with Xanax PRN. Sleep is affected by chest wall pain. Denies any anhedonia, SI or HI.  Left sided chest wall pain: Chronic, intermittent, worse at night radiating from LLQ abdomen and thoracic spine area. Associated with palpitations. Has tried Cymbalta, Gabapentin and Lyrica. Has had CT chest and Cardiology evaluation. She was referred to pain clinic in [place], and has had intercostal nerve blocks without any relief.  She has tried physical therapy with minimal relief as well.  Gastritis: She was referred to GI for epigastric pain and nausea.  She is taking pantoprazole for it.  Her epigastric pain is radiating pain from the left sided chest wall pain.  She has not seen any improvement with the pain with pantoprazole.  Past Medical History:  Diagnosis Date   Anxiety    Hyperlipidemia    Hypothyroidism    Posttraumatic stress disorder 03/01/2013    Past Surgical History:  Procedure Laterality Date   No prior surgery      Family History  Problem Relation Age of Onset   Colon cancer Mother    COPD Father     Social History   Socioeconomic History   Marital status: Married    Spouse name: Not on file   Number of children: 1   Years of education: HS   Highest education level: Not on file  Occupational History   Occupation: Occupational hygienist: OTHER     Comment: OWN/EMPLOYED AT [employer]  Tobacco Use   Smoking status: Never   Smokeless tobacco: Never  Vaping Use   Vaping Use: Never used  Substance and Sexual Activity   Alcohol use: No   Drug use: No   Sexual activity: Not Currently  Other Topics Concern   Not on file  Social History Narrative   Not on file   Social Determinants of Health   Financial Resource Strain: Low Risk  (07/11/2022)   Overall Financial Resource Strain (CARDIA)    Difficulty of Paying Living Expenses: Not hard at all  Food Insecurity: No Food Insecurity (07/06/2021)   Hunger Vital Sign    Worried About Running Out of Food in the Last Year: Never true    [place] in the Last Year: Never true  Transportation Needs: No Transportation Needs (07/11/2022)   PRAPARE - Hydrologist (Medical): No    Lack of Transportation (Non-Medical): No  Physical Activity: Sufficiently Active (07/11/2022)   Exercise Vital Sign    Days of Exercise per Week: 6 days    Minutes of Exercise per Session: 30 min  Stress: No Stress Concern Present (07/06/2021)   [place]    Feeling of Stress : Not at all  Social Connections: Moderately Integrated (07/11/2022)   Social Connection and Isolation Panel [  NHANES]    Frequency of Communication with Friends and Family: More than three times a week    Frequency of Social Gatherings with Friends and Family: More than three times a week    Attends Religious Services: 1 to 4 times per year    Active Member of [employer] or Organizations: No    Attends Archivist Meetings: Never    Marital Status: Married  Human resources officer Violence: Not At Risk (07/06/2021)   Humiliation, Afraid, Rape, and Kick questionnaire    Fear of Current or Ex-Partner: No    Emotionally Abused: No    Physically Abused: No    Sexually Abused: No    Outpatient Medications Prior to Visit  Medication Sig  Dispense Refill   traMADol (ULTRAM) 50 MG tablet Take 50 mg by mouth 2 (two) times daily.     Cholecalciferol 25 MCG (1000 UT) tablet Take 2 tablets by mouth daily.     cyanocobalamin 1000 MCG tablet Take by mouth.     levothyroxine (SYNTHROID) 75 MCG tablet Take 1 tablet (75 mcg total) by mouth daily before breakfast. 30 tablet 3   pantoprazole (PROTONIX) 40 MG tablet Take 1 tablet (40 mg total) by mouth daily before breakfast. 90 tablet 3   alendronate (FOSAMAX) 70 MG tablet TAKE 1 TABLET EVERY WEEK IN THE MORNING 30 MIN BEFORE EATING WITH AN 8OZ GLASS OF WATER (SIT UP 30 MIN) 12 tablet 3   ALPRAZolam (XANAX) 1 MG tablet Take 1 tablet (1 mg total) by mouth at bedtime as needed for anxiety. 60 tablet 1   cyclobenzaprine (FLEXERIL) 5 MG tablet Take 1 tablet (5 mg total) by mouth 3 (three) times daily as needed for muscle spasms. (Patient not taking: Reported on 11/28/2022) 30 tablet 1   pravastatin (PRAVACHOL) 40 MG tablet TAKE 1 TABLET BY MOUTH DAILY. 90 tablet 0   pregabalin (LYRICA) 75 MG capsule Take 1 capsule (75 mg total) by mouth 2 (two) times daily. (Patient not taking: Reported on 11/28/2022) 60 capsule 2   No facility-administered medications prior to visit.    Allergies  Allergen Reactions   Prednisone Other (See Comments)    Caused pain to be worse.    ROS Review of Systems  Constitutional:  Negative for chills and fever.  HENT:  Negative for congestion, sinus pressure, sinus pain and sore throat.   Eyes:  Negative for pain and discharge.  Respiratory:  Negative for cough and shortness of breath.        Chest wall pain  Cardiovascular:  Positive for palpitations. Negative for leg swelling.  Gastrointestinal:  Negative for diarrhea, nausea and vomiting.  Endocrine: Negative for polydipsia and polyuria.  Genitourinary:  Negative for dysuria and hematuria.  Musculoskeletal:  Negative for neck pain and neck stiffness.  Skin:  Negative for rash.  Neurological:  Negative for  dizziness and weakness.  Psychiatric/Behavioral:  Negative for agitation and behavioral problems.       Objective:    Physical Exam Vitals reviewed.  Constitutional:      General: She is not in acute distress.    Appearance: She is not diaphoretic.  HENT:     Head: Normocephalic and atraumatic.     Nose: Nose normal.     Mouth/Throat:     Mouth: Mucous membranes are moist.  Eyes:     General: No scleral icterus.    Extraocular Movements: Extraocular movements intact.  Cardiovascular:     Rate and Rhythm: Normal rate and regular  rhythm.     Pulses: Normal pulses.     Heart sounds: Normal heart sounds. No murmur heard. Pulmonary:     Breath sounds: Normal breath sounds. No wheezing or rales.  Musculoskeletal:     Cervical back: Neck supple. No tenderness.     Thoracic back: Tenderness present. Decreased range of motion (Due to pain).     Right lower leg: No edema.     Left lower leg: No edema.  Skin:    General: Skin is warm.     Findings: No rash.  Neurological:     General: No focal deficit present.     Mental Status: She is alert and oriented to person, place, and time.     Sensory: No sensory deficit.     Motor: No weakness.  Psychiatric:        Mood and Affect: Mood normal.        Behavior: Behavior normal.     BP 115/72 (BP Location: Right Arm, Patient Position: Sitting, Cuff Size: Normal)   Pulse 73   Ht '5\' 7"'$  (1.702 m)   Wt 119 lb 3.2 oz (54.1 kg)   SpO2 99%   BMI 18.67 kg/m  Wt Readings from Last 3 Encounters:  12/05/22 119 lb 3.2 oz (54.1 kg)  11/28/22 119 lb 3.2 oz (54.1 kg)  08/02/22 125 lb 4.8 oz (56.8 kg)    Lab Results  Component Value Date   TSH 11.300 (H) 08/02/2022   Lab Results  Component Value Date   WBC 5.6 06/01/2021   HGB 13.9 06/01/2021   HCT 42.3 06/01/2021   MCV 90 06/01/2021   PLT 195 06/01/2021   Lab Results  Component Value Date   NA 139 08/02/2022   K 4.7 08/02/2022   CO2 28 08/02/2022   GLUCOSE 99 08/02/2022    BUN 16 08/02/2022   CREATININE 1.00 08/02/2022   BILITOT 0.4 06/01/2021   ALKPHOS 90 06/01/2021   AST 17 06/01/2021   ALT 14 06/01/2021   PROT 6.8 06/01/2021   ALBUMIN 4.3 06/01/2021   CALCIUM 9.5 08/02/2022   EGFR 60 08/02/2022   Lab Results  Component Value Date   CHOL 184 12/29/2021   Lab Results  Component Value Date   HDL 70 12/29/2021   Lab Results  Component Value Date   LDLCALC 94 12/29/2021   Lab Results  Component Value Date   TRIG 116 12/29/2021   Lab Results  Component Value Date   CHOLHDL 2.6 12/29/2021   No results found for: "HGBA1C"    Assessment & Plan:   Problem List Items Addressed This Visit       Endocrine   Acquired hypothyroidism - Primary    Lab Results  Component Value Date   TSH 11.300 (H) 08/02/2022  On Levothyroxine 75 mcg QD, dose was increased after checking TSH Recheck TSH and free T4 Labile thyroid function, checked US thyroid      Relevant Orders   TSH + free T4   CMP14+EGFR     Musculoskeletal and Integument   Osteoporosis without current pathological fracture    On Alendronate Continue calcium and Vitamin D supplements      Relevant Medications   alendronate (FOSAMAX) 70 MG tablet   Other Relevant Orders   CMP14+EGFR     Other   Hyperlipidemia    On statin Check lipid profile      Relevant Medications   pravastatin (PRAVACHOL) 40 MG tablet   Other Relevant Orders  Lipid Profile   Insomnia   Relevant Medications   ALPRAZolam (XANAX) 1 MG tablet   GAD (generalized anxiety disorder)    Well-controlled with Xanax 1 mg PRN, takes it BID sometimes Refilled after reviewing PDMP today      Relevant Medications   ALPRAZolam (XANAX) 1 MG tablet   Thoracic facet syndrome    Chronic left-sided chest wall pain, could be radiating from thoracic spine Followed by [facility]      Relevant Medications   traMADol (ULTRAM) 50 MG tablet   Intercostal neuralgia    Intermittent, appears to  radiating from LLQ to anterior axillary border Does not seem to cardiac related pain Unclear etiology Previous imaging studies were insignificant including CT chest Has tried Cymbalta, Gabapentin and Lyrica in the past Referred to pain clinic -last visit note reviewed, has had intercoastal nerve block and PT DC Lyrica 75 mg TID as there was no benefit      Relevant Orders   CMP14+EGFR   CBC with Differential/Platelet    Meds ordered this encounter  Medications   alendronate (FOSAMAX) 70 MG tablet    Sig: TAKE 1 TABLET EVERY WEEK IN THE MORNING 30 MIN BEFORE EATING WITH AN 8OZ GLASS OF WATER (SIT UP 30 MIN)    Dispense:  12 tablet    Refill:  3   ALPRAZolam (XANAX) 1 MG tablet    Sig: Take 1 tablet (1 mg total) by mouth at bedtime as needed for anxiety.    Dispense:  60 tablet    Refill:  1   pravastatin (PRAVACHOL) 40 MG tablet    Sig: Take 1 tablet (40 mg total) by mouth daily.    Dispense:  90 tablet    Refill:  3    Follow-up: Return in about 5 months (around 05/05/2023) for Annual physical (after 07/06).    Lindell Spar, MD

## 2022-12-05 NOTE — Assessment & Plan Note (Signed)
Well-controlled with Xanax 1 mg PRN, takes it BID sometimes Refilled after reviewing PDMP today

## 2022-12-05 NOTE — Assessment & Plan Note (Signed)
Lab Results  Component Value Date   TSH 11.300 (H) 08/02/2022   On Levothyroxine 75 mcg QD, dose was increased after checking TSH Recheck TSH and free T4 Labile thyroid function, checked US thyroid

## 2022-12-05 NOTE — Assessment & Plan Note (Addendum)
Intermittent, appears to radiating from LLQ to anterior axillary border Does not seem to cardiac related pain Unclear etiology Previous imaging studies were insignificant including CT chest Has tried Cymbalta, Gabapentin and Lyrica in the past Referred to pain clinic -last visit note reviewed, has had intercoastal nerve block and PT DC Lyrica 75 mg TID as there was no benefit

## 2022-12-06 LAB — CBC WITH DIFFERENTIAL/PLATELET
Basophils Absolute: 0 10*3/uL (ref 0.0–0.2)
Basos: 0 %
EOS (ABSOLUTE): 0.1 10*3/uL (ref 0.0–0.4)
Eos: 2 %
Hematocrit: 40.5 % (ref 34.0–46.6)
Hemoglobin: 13.6 g/dL (ref 11.1–15.9)
Immature Grans (Abs): 0 10*3/uL (ref 0.0–0.1)
Immature Granulocytes: 0 %
Lymphocytes Absolute: 1.4 10*3/uL (ref 0.7–3.1)
Lymphs: 28 %
MCH: 30.2 pg (ref 26.6–33.0)
MCHC: 33.6 g/dL (ref 31.5–35.7)
MCV: 90 fL (ref 79–97)
Monocytes Absolute: 0.4 10*3/uL (ref 0.1–0.9)
Monocytes: 8 %
Neutrophils Absolute: 3 10*3/uL (ref 1.4–7.0)
Neutrophils: 62 %
Platelets: 210 10*3/uL (ref 150–450)
RBC: 4.51 x10E6/uL (ref 3.77–5.28)
RDW: 12 % (ref 11.7–15.4)
WBC: 4.9 10*3/uL (ref 3.4–10.8)

## 2022-12-06 LAB — CMP14+EGFR
ALT: 21 IU/L (ref 0–32)
AST: 24 IU/L (ref 0–40)
Albumin/Globulin Ratio: 2.1 (ref 1.2–2.2)
Albumin: 4.2 g/dL (ref 3.8–4.8)
Alkaline Phosphatase: 89 IU/L (ref 44–121)
BUN/Creatinine Ratio: 16 (ref 12–28)
BUN: 12 mg/dL (ref 8–27)
Bilirubin Total: 0.4 mg/dL (ref 0.0–1.2)
CO2: 24 mmol/L (ref 20–29)
Calcium: 9.5 mg/dL (ref 8.7–10.3)
Chloride: 103 mmol/L (ref 96–106)
Creatinine, Ser: 0.76 mg/dL (ref 0.57–1.00)
Globulin, Total: 2 g/dL (ref 1.5–4.5)
Glucose: 109 mg/dL — ABNORMAL HIGH (ref 70–99)
Potassium: 4.6 mmol/L (ref 3.5–5.2)
Sodium: 142 mmol/L (ref 134–144)
Total Protein: 6.2 g/dL (ref 6.0–8.5)
eGFR: 84 mL/min/{1.73_m2} (ref >59)

## 2022-12-06 LAB — TSH+FREE T4
Free T4: 1.59 ng/dL (ref 0.82–1.77)
TSH: 0.026 u[IU]/mL — ABNORMAL LOW (ref 0.450–4.500)

## 2022-12-06 LAB — LIPID PANEL
Chol/HDL Ratio: 2.4 ratio (ref 0.0–4.4)
Cholesterol, Total: 189 mg/dL (ref 100–199)
HDL: 80 mg/dL (ref >39)
LDL Chol Calc (NIH): 92 mg/dL (ref 0–99)
Triglycerides: 93 mg/dL (ref 0–149)
VLDL Cholesterol Cal: 17 mg/dL (ref 5–40)

## 2022-12-07 ENCOUNTER — Other Ambulatory Visit: Payer: Self-pay | Admitting: Internal Medicine

## 2022-12-07 DIAGNOSIS — E039 Hypothyroidism, unspecified: Secondary | ICD-10-CM

## 2022-12-07 MED ORDER — LEVOTHYROXINE SODIUM 75 MCG PO TABS: 37.5000 ug | ORAL_TABLET | Freq: Every day | ORAL | 2 refills | Status: DC

## 2022-12-07 MED ORDER — LEVOTHYROXINE SODIUM 25 MCG PO TABS: 25.0000 ug | ORAL_TABLET | Freq: Every day | ORAL | 2 refills | Status: DC

## 2022-12-08 ENCOUNTER — Telehealth: Payer: Self-pay | Admitting: Internal Medicine

## 2022-12-08 NOTE — Telephone Encounter (Signed)
Pt called stating that when she pick up her tsh med she pick up 2 different mg & directions question as well

## 2022-12-08 NOTE — Telephone Encounter (Signed)
Spoke to patient

## 2023-01-13 DIAGNOSIS — R0781 Pleurodynia: Secondary | ICD-10-CM | POA: Diagnosis not present

## 2023-01-13 DIAGNOSIS — G588 Other specified mononeuropathies: Secondary | ICD-10-CM | POA: Diagnosis not present

## 2023-01-13 DIAGNOSIS — M47894 Other spondylosis, thoracic region: Secondary | ICD-10-CM | POA: Diagnosis not present

## 2023-01-19 DIAGNOSIS — R0781 Pleurodynia: Secondary | ICD-10-CM | POA: Diagnosis not present

## 2023-02-05 DIAGNOSIS — K121 Other forms of stomatitis: Secondary | ICD-10-CM | POA: Diagnosis not present

## 2023-02-27 ENCOUNTER — Ambulatory Visit: Payer: PPO | Admitting: Gastroenterology

## 2023-04-04 ENCOUNTER — Other Ambulatory Visit: Payer: Self-pay | Admitting: Internal Medicine

## 2023-04-04 DIAGNOSIS — E039 Hypothyroidism, unspecified: Secondary | ICD-10-CM

## 2023-05-09 ENCOUNTER — Ambulatory Visit: Payer: PPO | Admitting: Internal Medicine

## 2023-05-09 ENCOUNTER — Encounter: Payer: Self-pay | Admitting: Internal Medicine

## 2023-05-09 VITALS — BP 113/64 | HR 62 | Ht 67.0 in | Wt 114.6 lb

## 2023-05-09 DIAGNOSIS — E039 Hypothyroidism, unspecified: Secondary | ICD-10-CM

## 2023-05-09 DIAGNOSIS — F411 Generalized anxiety disorder: Secondary | ICD-10-CM | POA: Diagnosis not present

## 2023-05-09 DIAGNOSIS — G4700 Insomnia, unspecified: Secondary | ICD-10-CM

## 2023-05-09 DIAGNOSIS — E559 Vitamin D deficiency, unspecified: Secondary | ICD-10-CM | POA: Diagnosis not present

## 2023-05-09 DIAGNOSIS — E782 Mixed hyperlipidemia: Secondary | ICD-10-CM | POA: Diagnosis not present

## 2023-05-09 DIAGNOSIS — M810 Age-related osteoporosis without current pathological fracture: Secondary | ICD-10-CM | POA: Diagnosis not present

## 2023-05-09 DIAGNOSIS — R739 Hyperglycemia, unspecified: Secondary | ICD-10-CM | POA: Diagnosis not present

## 2023-05-09 DIAGNOSIS — G588 Other specified mononeuropathies: Secondary | ICD-10-CM

## 2023-05-09 DIAGNOSIS — Z0001 Encounter for general adult medical examination with abnormal findings: Secondary | ICD-10-CM | POA: Diagnosis not present

## 2023-05-09 DIAGNOSIS — E010 Iodine-deficiency related diffuse (endemic) goiter: Secondary | ICD-10-CM | POA: Diagnosis not present

## 2023-05-09 DIAGNOSIS — L720 Epidermal cyst: Secondary | ICD-10-CM

## 2023-05-09 DIAGNOSIS — D169 Benign neoplasm of bone and articular cartilage, unspecified: Secondary | ICD-10-CM | POA: Insufficient documentation

## 2023-05-09 MED ORDER — ALPRAZOLAM 1 MG PO TABS
1.0000 mg | ORAL_TABLET | Freq: Every evening | ORAL | 1 refills | Status: DC | PRN
Start: 2023-05-09 — End: 2023-09-27

## 2023-05-09 NOTE — Assessment & Plan Note (Signed)
Takes Xanax as needed.

## 2023-05-09 NOTE — Assessment & Plan Note (Signed)
On statin Check lipid profile 

## 2023-05-09 NOTE — Assessment & Plan Note (Signed)
On Alendronate Continue calcium and Vitamin D supplements Her throat soreness and odynophagia could be due to alendronate as well -if US thyroid is benign, can try to DC alendronate to see if she has improvement in her throat soreness

## 2023-05-09 NOTE — Assessment & Plan Note (Signed)
Referred to dermatology for further evaluation.

## 2023-05-09 NOTE — Assessment & Plan Note (Signed)

## 2023-05-09 NOTE — Patient Instructions (Addendum)
Please continue to take medications as prescribed.  Please continue to follow low carb diet and perform moderate exercise/walking at least 150 mins/week.  You are being referred to Dermatology for scalp cyst.

## 2023-05-09 NOTE — Assessment & Plan Note (Signed)
Noted on physical exam, with tenderness on the right side of the neck Check [place] of thyroid

## 2023-05-09 NOTE — Assessment & Plan Note (Signed)
Well-controlled with Xanax 1 mg PRN, takes it BID sometimes Refilled after reviewing PDMP today 

## 2023-05-09 NOTE — Progress Notes (Signed)
Established Patient Office Visit  Subjective:  Patient ID: Ruth Jennings, female    DOB: [birth year]  Age: 72 y.o. MRN: [id number]  CC:  Chief Complaint  Patient presents with   Annual Exam   Cyst    Patient has a knot on the back of her head   Arthritis    Patient feels she has arthritis in right hand     HPI Ruth Jennings is a 72 y.o. female with past medical history of hypothyroidism, CKD stage 3a, anxiety and HLD who presents for annual physical.  Left sided chest wall pain: Chronic, intermittent, worse at night radiating from LLQ abdomen and thoracic spine area. Associated with palpitations. Has tried Cymbalta, Gabapentin and Lyrica. Has had CT chest and Cardiology evaluation. She was referred to pain clinic in [place], and has had intercostal nerve blocks without any relief.  She has tried physical therapy with minimal relief as well.    Hypothyroidism: Takes Levothyroxine 62.5 mcg QD. Dose was recently decreased due to low TSH.  She has chronic hair loss concern.  Denies any constipation, diarrhea, recent change in appetite or weight.  She reports right-sided throat soreness and odynophagia.  She has tried warm water gargling without much benefit.   Anxiety: Well-controlled with Xanax PRN. Sleep is affected by chest wall pain. Denies any anhedonia, SI or HI.  She reports numbness in the right hand index fingertip, which is constant.  Denies any burning pain.  Denies any wrist pain currently.  Denies any recent injury.  She also reports a bump on the occipital area that she noticed about 2 months ago.  Denies any recent change in size or shape.  Denies any local soreness or itching.   Past Medical History:  Diagnosis Date   Anxiety    Hyperlipidemia    Hypothyroidism    Posttraumatic stress disorder 03/01/2013    Past Surgical History:  Procedure Laterality Date   No prior surgery      Family History  Problem Relation Age of Onset   Colon cancer Mother    COPD  Father     Social History   Socioeconomic History   Marital status: Married    Spouse name: Not on file   Number of children: 1   Years of education: HS   Highest education level: Not on file  Occupational History   Occupation: Pharmacologist: OTHER    Comment: OWN/EMPLOYED AT [employer]  Tobacco Use   Smoking status: Never   Smokeless tobacco: Never  Vaping Use   Vaping Use: Never used  Substance and Sexual Activity   Alcohol use: No   Drug use: No   Sexual activity: Not Currently  Other Topics Concern   Not on file  Social History Narrative   Not on file   Social Determinants of Health   Financial Resource Strain: Low Risk  (07/11/2022)   Overall Financial Resource Strain (CARDIA)    Difficulty of Paying Living Expenses: Not hard at all  Food Insecurity: No Food Insecurity (07/06/2021)   Hunger Vital Sign    Worried About Running Out of Food in the Last Year: Never true    Ran Out of Food in the Last Year: Never true  Transportation Needs: No Transportation Needs (07/11/2022)   PRAPARE - Administrator, Civil Service (Medical): No    Lack of Transportation (Non-Medical): No  Physical Activity: Sufficiently Active (07/11/2022)   Exercise Vital Sign  Days of Exercise per Week: 6 days    Minutes of Exercise per Session: 30 min  Stress: No Stress Concern Present (07/06/2021)   [employer] of Occupational Health - Occupational Stress Questionnaire    Feeling of Stress : Not at all  Social Connections: Moderately Integrated (07/11/2022)   Social Connection and Isolation Panel [NHANES]    Frequency of Communication with Friends and Family: More than three times a week    Frequency of Social Gatherings with Friends and Family: More than three times a week    Attends Religious Services: 1 to 4 times per year    Active Member of [employer] or Organizations: No    Attends Banker Meetings: Never    Marital Status: Married   Catering manager Violence: Not At Risk (07/06/2021)   Humiliation, Afraid, Rape, and Kick questionnaire    Fear of Current or Ex-Partner: No    Emotionally Abused: No    Physically Abused: No    Sexually Abused: No    Outpatient Medications Prior to Visit  Medication Sig Dispense Refill   alendronate (FOSAMAX) 70 MG tablet TAKE 1 TABLET EVERY WEEK IN THE MORNING 30 MIN BEFORE EATING WITH AN 8OZ GLASS OF WATER (SIT UP 30 MIN) 12 tablet 3   Cholecalciferol 25 MCG (1000 UT) tablet Take 2 tablets by mouth daily.     cyanocobalamin 1000 MCG tablet Take by mouth.     levothyroxine (SYNTHROID) 25 MCG tablet TAKE (1) TABLET BY MOUTHONCE DAILY. TAKE WITH LEVOTHYROXINE 37.5MCG TO EQUAL 62.5MCG. 60 tablet 0   levothyroxine (SYNTHROID) 75 MCG tablet Take 0.5 tablets (37.5 mcg total) by mouth daily before breakfast. Take with Levothyroxine 25 mcg to get total 62.5 mcg. 30 tablet 2   pravastatin (PRAVACHOL) 40 MG tablet Take 1 tablet (40 mg total) by mouth daily. 90 tablet 3   ALPRAZolam (XANAX) 1 MG tablet Take 1 tablet (1 mg total) by mouth at bedtime as needed for anxiety. 60 tablet 1   pantoprazole (PROTONIX) 40 MG tablet Take 1 tablet (40 mg total) by mouth daily before breakfast. 90 tablet 3   traMADol (ULTRAM) 50 MG tablet Take 50 mg by mouth 2 (two) times daily.     No facility-administered medications prior to visit.    Allergies  Allergen Reactions   Prednisone Other (See Comments)    Caused pain to be worse.    ROS Review of Systems  Constitutional:  Negative for chills and fever.  HENT:  Negative for congestion, sinus pressure, sinus pain and sore throat.   Eyes:  Negative for pain and discharge.  Respiratory:  Negative for cough and shortness of breath.   Cardiovascular:  Positive for palpitations. Negative for leg swelling.  Gastrointestinal:  Positive for abdominal pain. Negative for diarrhea, nausea and vomiting.  Endocrine: Negative for polydipsia and polyuria.   Genitourinary:  Negative for dysuria and hematuria.  Musculoskeletal:  Negative for neck pain and neck stiffness.  Skin:  Negative for rash.       Cyst of scalp  Neurological:  Negative for dizziness and weakness.  Psychiatric/Behavioral:  Negative for agitation and behavioral problems.       Objective:    Physical Exam Vitals reviewed.  Constitutional:      General: She is not in acute distress.    Appearance: She is not diaphoretic.  HENT:     Head: Normocephalic and atraumatic.     Nose: Nose normal.     Mouth/Throat:  Mouth: Mucous membranes are moist.  Eyes:     General: No scleral icterus.    Extraocular Movements: Extraocular movements intact.  Neck:     Thyroid: Thyromegaly present.     Comments: Slight tenderness over right side of neck Cardiovascular:     Rate and Rhythm: Normal rate and regular rhythm.     Pulses: Normal pulses.     Heart sounds: Normal heart sounds. No murmur heard. Pulmonary:     Breath sounds: Normal breath sounds. No wheezing or rales.  Abdominal:     Palpations: Abdomen is soft.     Tenderness: There is no abdominal tenderness.  Musculoskeletal:     Cervical back: Neck supple.     Thoracic back: Tenderness present. Decreased range of motion (Due to pain).     Right lower leg: No edema.     Left lower leg: No edema.  Skin:    General: Skin is warm.     Findings: No rash.     Comments: About 2 cm in diameter epidermal cyst over scalp area  Neurological:     General: No focal deficit present.     Mental Status: She is alert and oriented to person, place, and time.     Cranial Nerves: No cranial nerve deficit.     Sensory: Sensory deficit (Right index fingertip) present.     Motor: No weakness.  Psychiatric:        Mood and Affect: Mood normal.        Behavior: Behavior normal.     BP 113/64 (BP Location: Right Arm, Patient Position: Sitting, Cuff Size: Normal)   Pulse 62   Ht 5\' 7"  (1.702 m)   Wt 114 lb 9.6 oz (52 kg)    SpO2 98%   BMI 17.95 kg/m  Wt Readings from Last 3 Encounters:  05/09/23 114 lb 9.6 oz (52 kg)  12/05/22 119 lb 3.2 oz (54.1 kg)  11/28/22 119 lb 3.2 oz (54.1 kg)    Lab Results  Component Value Date   TSH 0.026 (L) 12/05/2022   Lab Results  Component Value Date   WBC 4.9 12/05/2022   HGB 13.6 12/05/2022   HCT 40.5 12/05/2022   MCV 90 12/05/2022   PLT 210 12/05/2022   Lab Results  Component Value Date   NA 142 12/05/2022   K 4.6 12/05/2022   CO2 24 12/05/2022   GLUCOSE 109 (H) 12/05/2022   BUN 12 12/05/2022   CREATININE 0.76 12/05/2022   BILITOT 0.4 12/05/2022   ALKPHOS 89 12/05/2022   AST 24 12/05/2022   ALT 21 12/05/2022   PROT 6.2 12/05/2022   ALBUMIN 4.2 12/05/2022   CALCIUM 9.5 12/05/2022   EGFR 84 12/05/2022   Lab Results  Component Value Date   CHOL 189 12/05/2022   Lab Results  Component Value Date   HDL 80 12/05/2022   Lab Results  Component Value Date   LDLCALC 92 12/05/2022   Lab Results  Component Value Date   TRIG 93 12/05/2022   Lab Results  Component Value Date   CHOLHDL 2.4 12/05/2022   No results found for: "HGBA1C"    Assessment & Plan:   Problem List Items Addressed This Visit       Endocrine   Acquired hypothyroidism    Lab Results  Component Value Date   TSH 0.026 (L) 12/05/2022  On Levothyroxine 62.5 mcg QD, dose was decreased after checking TSH Recheck TSH and free T4 Labile thyroid function and  right-sided throat soreness with odynophagia, check US thyroid      Relevant Orders   CMP14+EGFR   CBC with Differential/Platelet   TSH + free T4   Thyromegaly    Noted on physical exam, with tenderness on the right side of the neck Check [place] of thyroid      Relevant Orders   US THYROID     Musculoskeletal and Integument   Osteoporosis without current pathological fracture    On Alendronate Continue calcium and Vitamin D supplements Her throat soreness and odynophagia could be due to alendronate as well -if US  thyroid is benign, can try to DC alendronate to see if she has improvement in her throat soreness      Relevant Orders   DG Bone Density   Epidermoid cyst of skin of scalp    Referred to dermatology for further evaluation      Relevant Orders   Ambulatory referral to Dermatology     Other   GAD (generalized anxiety disorder) (Chronic)    Well-controlled with Xanax 1 mg PRN, takes it BID sometimes Refilled after reviewing PDMP today      Relevant Medications   ALPRAZolam (XANAX) 1 MG tablet   Hyperlipidemia    On statin Check lipid profile      Relevant Orders   Lipid panel   Insomnia    Takes Xanax as needed      Relevant Medications   ALPRAZolam (XANAX) 1 MG tablet   Vitamin D deficiency   Relevant Orders   VITAMIN D 25 Hydroxy (Vit-D Deficiency, Fractures)   Encounter for general adult medical examination with abnormal findings - Primary    Physical exam as documented. Counseling done  re healthy lifestyle involving commitment to 150 minutes exercise per week, heart healthy diet, and attaining healthy weight.The importance of adequate sleep also discussed. Changes in health habits are decided on by the patient with goals and time frames  set for achieving them. Immunization and cancer screening needs are specifically addressed at this visit.      Intercostal neuralgia    Intermittent, appears to radiating from LLQ to anterior axillary border Does not seem to cardiac related pain Unclear etiology Previous imaging studies were insignificant including CT chest Has tried Cymbalta, Gabapentin and Lyrica in the past Referred to pain clinic -last visit note reviewed, has had intercoastal nerve block and PT      Other Visit Diagnoses     Hyperglycemia       Relevant Orders   Hemoglobin A1c       Meds ordered this encounter  Medications   ALPRAZolam (XANAX) 1 MG tablet    Sig: Take 1 tablet (1 mg total) by mouth at bedtime as needed for anxiety.    Dispense:   60 tablet    Refill:  1    Follow-up: Return in about 4 months (around 09/09/2023) for Hypothyroidism and neck soreness.    Anabel Halon, MD

## 2023-05-09 NOTE — Assessment & Plan Note (Signed)
Intermittent, appears to radiating from LLQ to anterior axillary border Does not seem to cardiac related pain Unclear etiology Previous imaging studies were insignificant including CT chest Has tried Cymbalta, Gabapentin and Lyrica in the past Referred to pain clinic -last visit note reviewed, has had intercoastal nerve block and PT

## 2023-05-09 NOTE — Assessment & Plan Note (Signed)
Lab Results  Component Value Date   TSH 0.026 (L) 12/05/2022   On Levothyroxine 62.5 mcg QD, dose was decreased after checking TSH Recheck TSH and free T4 Labile thyroid function and right-sided throat soreness with odynophagia, check US thyroid

## 2023-05-10 ENCOUNTER — Other Ambulatory Visit: Payer: Self-pay | Admitting: Internal Medicine

## 2023-05-10 DIAGNOSIS — E039 Hypothyroidism, unspecified: Secondary | ICD-10-CM

## 2023-05-10 LAB — CBC WITH DIFFERENTIAL/PLATELET
Basophils Absolute: 0 10*3/uL (ref 0.0–0.2)
Basos: 1 %
EOS (ABSOLUTE): 0.1 10*3/uL (ref 0.0–0.4)
Eos: 1 %
Hematocrit: 43.1 % (ref 34.0–46.6)
Hemoglobin: 13.7 g/dL (ref 11.1–15.9)
Immature Grans (Abs): 0 10*3/uL (ref 0.0–0.1)
Immature Granulocytes: 0 %
Lymphocytes Absolute: 1.4 10*3/uL (ref 0.7–3.1)
Lymphs: 27 %
MCH: 28.7 pg (ref 26.6–33.0)
MCHC: 31.8 g/dL (ref 31.5–35.7)
MCV: 90 fL (ref 79–97)
Monocytes Absolute: 0.4 10*3/uL (ref 0.1–0.9)
Monocytes: 8 %
Neutrophils Absolute: 3.3 10*3/uL (ref 1.4–7.0)
Neutrophils: 63 %
Platelets: 185 10*3/uL (ref 150–450)
RBC: 4.78 x10E6/uL (ref 3.77–5.28)
RDW: 12.7 % (ref 11.7–15.4)
WBC: 5.2 10*3/uL (ref 3.4–10.8)

## 2023-05-10 LAB — CMP14+EGFR
ALT: 12 IU/L (ref 0–32)
AST: 19 IU/L (ref 0–40)
Albumin: 4.4 g/dL (ref 3.8–4.8)
Alkaline Phosphatase: 70 IU/L (ref 44–121)
BUN/Creatinine Ratio: 12 (ref 12–28)
BUN: 12 mg/dL (ref 8–27)
Bilirubin Total: 0.6 mg/dL (ref 0.0–1.2)
CO2: 24 mmol/L (ref 20–29)
Calcium: 9.6 mg/dL (ref 8.7–10.3)
Chloride: 104 mmol/L (ref 96–106)
Creatinine, Ser: 1.03 mg/dL — ABNORMAL HIGH (ref 0.57–1.00)
Globulin, Total: 2.3 g/dL (ref 1.5–4.5)
Glucose: 88 mg/dL (ref 70–99)
Potassium: 4.4 mmol/L (ref 3.5–5.2)
Sodium: 142 mmol/L (ref 134–144)
Total Protein: 6.7 g/dL (ref 6.0–8.5)
eGFR: 58 mL/min/{1.73_m2} — ABNORMAL LOW (ref >59)

## 2023-05-10 LAB — LIPID PANEL
Chol/HDL Ratio: 1.9 ratio (ref 0.0–4.4)
Cholesterol, Total: 165 mg/dL (ref 100–199)
HDL: 86 mg/dL (ref >39)
LDL Chol Calc (NIH): 62 mg/dL (ref 0–99)
Triglycerides: 97 mg/dL (ref 0–149)
VLDL Cholesterol Cal: 17 mg/dL (ref 5–40)

## 2023-05-10 LAB — TSH+FREE T4
Free T4: 1.95 ng/dL — ABNORMAL HIGH (ref 0.82–1.77)
TSH: 0.05 u[IU]/mL — ABNORMAL LOW (ref 0.450–4.500)

## 2023-05-10 LAB — VITAMIN D 25 HYDROXY (VIT D DEFICIENCY, FRACTURES): Vit D, 25-Hydroxy: 46.5 ng/mL (ref 30.0–100.0)

## 2023-05-10 LAB — HEMOGLOBIN A1C
Est. average glucose Bld gHb Est-mCnc: 117 mg/dL
Hgb A1c MFr Bld: 5.7 % — ABNORMAL HIGH (ref 4.8–5.6)

## 2023-05-10 MED ORDER — LEVOTHYROXINE SODIUM 50 MCG PO TABS
50.0000 ug | ORAL_TABLET | Freq: Every day | ORAL | 1 refills | Status: DC
Start: 2023-05-10 — End: 2023-09-25

## 2023-05-17 ENCOUNTER — Ambulatory Visit (HOSPITAL_COMMUNITY): Admission: RE | Admit: 2023-05-17 | Payer: PPO | Source: Ambulatory Visit

## 2023-05-17 ENCOUNTER — Ambulatory Visit (HOSPITAL_COMMUNITY)
Admission: RE | Admit: 2023-05-17 | Discharge: 2023-05-17 | Disposition: A | Discharge status: Home / Self Care | Payer: PPO | Source: Ambulatory Visit | Attending: Internal Medicine | Admitting: Internal Medicine

## 2023-05-17 DIAGNOSIS — M810 Age-related osteoporosis without current pathological fracture: Secondary | ICD-10-CM | POA: Insufficient documentation

## 2023-05-17 DIAGNOSIS — E010 Iodine-deficiency related diffuse (endemic) goiter: Secondary | ICD-10-CM

## 2023-05-17 DIAGNOSIS — R1319 Other dysphagia: Secondary | ICD-10-CM | POA: Diagnosis not present

## 2023-05-19 ENCOUNTER — Other Ambulatory Visit: Payer: Self-pay

## 2023-05-19 DIAGNOSIS — E039 Hypothyroidism, unspecified: Secondary | ICD-10-CM

## 2023-06-16 ENCOUNTER — Other Ambulatory Visit (HOSPITAL_COMMUNITY): Payer: Self-pay | Admitting: Internal Medicine

## 2023-06-16 DIAGNOSIS — Z1231 Encounter for screening mammogram for malignant neoplasm of breast: Secondary | ICD-10-CM

## 2023-06-22 ENCOUNTER — Encounter (HOSPITAL_COMMUNITY): Payer: Self-pay

## 2023-06-22 ENCOUNTER — Ambulatory Visit (HOSPITAL_COMMUNITY)
Admission: RE | Admit: 2023-06-22 | Discharge: 2023-06-22 | Disposition: A | Discharge status: Home / Self Care | Payer: PPO | Source: Ambulatory Visit | Attending: Internal Medicine | Admitting: Internal Medicine

## 2023-06-22 DIAGNOSIS — Z1231 Encounter for screening mammogram for malignant neoplasm of breast: Secondary | ICD-10-CM | POA: Insufficient documentation

## 2023-07-11 ENCOUNTER — Ambulatory Visit (INDEPENDENT_AMBULATORY_CARE_PROVIDER_SITE_OTHER): Payer: PPO

## 2023-07-11 DIAGNOSIS — Z23 Encounter for immunization: Secondary | ICD-10-CM

## 2023-07-26 ENCOUNTER — Ambulatory Visit
Admission: EM | Admit: 2023-07-26 | Discharge: 2023-07-26 | Disposition: A | Discharge status: Home / Self Care | Payer: PPO | Attending: Family Medicine | Admitting: Family Medicine

## 2023-07-26 ENCOUNTER — Encounter: Payer: Self-pay | Admitting: Emergency Medicine

## 2023-07-26 DIAGNOSIS — J0100 Acute maxillary sinusitis, unspecified: Secondary | ICD-10-CM

## 2023-07-26 MED ORDER — AMOXICILLIN-POT CLAVULANATE 875-125 MG PO TABS: 1.0000 | ORAL_TABLET | Freq: Two times a day (BID) | ORAL | 0 refills | Status: DC

## 2023-07-26 NOTE — ED Notes (Signed)
Has been taking zyrtec and using saline rinses without much relief.

## 2023-07-26 NOTE — ED Provider Notes (Signed)
[facility]    CSN: [id number] Arrival date & time: 07/26/23  0815      History   Chief Complaint No chief complaint on file.   HPI Ruth Jennings is a 72 y.o. female.   Patient presenting today with over a week of nasal congestion, sinus pain and pressure, productive cough, weakness, fatigue.  Denies fever, chest pain, shortness of breath, abdominal pain, nausea vomiting or diarrhea.  So far trying Zyrtec, saline sinus rinses with no relief.  Husband has been sick with similar symptoms.  History of seasonal allergies, no known history of chronic pulmonary disease.    Past Medical History:  Diagnosis Date   Anxiety    Hyperlipidemia    Hypothyroidism    Posttraumatic stress disorder 03/01/2013    Patient Active Problem List   Diagnosis Date Noted   Thyromegaly 05/09/2023   Epidermoid cyst of skin of scalp 05/09/2023   Intercostal neuralgia 08/02/2022   Acute pain of both knees 07/19/2022   Degeneration, intervertebral disc, lumbar 07/19/2022   Thoracic facet syndrome 07/19/2022   Thoracic spondylosis 06/13/2022   Encounter for general adult medical examination with abnormal findings 05/05/2022   Chronic midline thoracic back pain 09/01/2021   GAD (generalized anxiety disorder) 09/01/2021   Carpal tunnel syndrome of right wrist 09/01/2021   Osteoporosis without current pathological fracture 02/05/2021   Constipation 01/27/2021   Diverticulosis of small intestine 01/27/2021   Family history of malignant neoplasm of gastrointestinal tract 01/27/2021   Personal history of colonic polyps 01/27/2021   Heart palpitations 01/12/2021   Left-sided chest wall pain 01/12/2021   Chronic left shoulder pain 04/10/2019   Acquired hypothyroidism 01/13/2016   Cataplexy 01/13/2016   Hyperlipidemia 01/13/2016   Insomnia 01/13/2016   Vitamin D deficiency 01/13/2016    Past Surgical History:  Procedure Laterality Date   No prior surgery      OB History   No obstetric  history on file.      Home Medications    Prior to Admission medications   Medication Sig Start Date End Date Taking? Authorizing Provider  amoxicillin-clavulanate (AUGMENTIN) 875-125 MG tablet Take 1 tablet by mouth every 12 (twelve) hours. 07/26/23  Yes Particia Nearing, PA-C  levothyroxine (SYNTHROID) 50 MCG tablet Take 1 tablet (50 mcg total) by mouth daily. 05/10/23   Anabel Halon, MD  alendronate (FOSAMAX) 70 MG tablet TAKE 1 TABLET EVERY WEEK IN THE MORNING 30 MIN BEFORE EATING WITH AN 8OZ GLASS OF WATER (SIT UP 30 MIN) 12/05/22   Allena Katz, Earlie Lou, MD  ALPRAZolam Prudy Feeler) 1 MG tablet Take 1 tablet (1 mg total) by mouth at bedtime as needed for anxiety. 05/09/23   Anabel Halon, MD  Cholecalciferol 25 MCG (1000 UT) tablet Take 2 tablets by mouth daily.    [provider]  cyanocobalamin 1000 MCG tablet Take by mouth.    [provider]  pravastatin (PRAVACHOL) 40 MG tablet Take 1 tablet (40 mg total) by mouth daily. 12/05/22   Anabel Halon, MD    Family History Family History  Problem Relation Age of Onset   Colon cancer Mother    COPD Father     Social History Social History   Tobacco Use   Smoking status: Never   Smokeless tobacco: Never  Vaping Use   Vaping status: Never Used  Substance Use Topics   Alcohol use: No   Drug use: No     Allergies   Prednisone   Review  of Systems Review of Systems Per HPI  Physical Exam Triage Vital Signs ED Triage Vitals  Encounter Vitals Group     BP 07/26/23 0821 (!) 94/59     Systolic BP Percentile --      Diastolic BP Percentile --      Pulse Rate 07/26/23 0821 78     Resp 07/26/23 0821 18     Temp 07/26/23 0821 98 F (36.7 C)     Temp Source 07/26/23 0821 Oral     SpO2 07/26/23 0821 98 %     Weight --      Height --      Head Circumference --      Peak Flow --      Pain Score 07/26/23 0822 0     Pain Loc --      Pain Education --      Exclude from Growth Chart --    No data  found.  Updated Vital Signs BP (!) 94/59 (BP Location: Right Arm)   Pulse 78   Temp 98 F (36.7 C) (Oral)   Resp 18   SpO2 98%   Visual Acuity Right Eye Distance:   Left Eye Distance:   Bilateral Distance:    Right Eye Near:   Left Eye Near:    Bilateral Near:     Physical Exam Vitals and nursing note reviewed.  Constitutional:      Appearance: Normal appearance.  HENT:     Head: Atraumatic.     Right Ear: Tympanic membrane and external ear normal.     Left Ear: Tympanic membrane and external ear normal.     Nose: Congestion present.     Mouth/Throat:     Mouth: Mucous membranes are moist.     Pharynx: Posterior oropharyngeal erythema present.  Eyes:     Extraocular Movements: Extraocular movements intact.     Conjunctiva/sclera: Conjunctivae normal.  Cardiovascular:     Rate and Rhythm: Normal rate and regular rhythm.     Heart sounds: Normal heart sounds.  Pulmonary:     Effort: Pulmonary effort is normal.     Breath sounds: Normal breath sounds. No wheezing or rales.  Musculoskeletal:        General: Normal range of motion.     Cervical back: Normal range of motion and neck supple.  Skin:    General: Skin is warm and dry.  Neurological:     Mental Status: She is alert and oriented to person, place, and time.  Psychiatric:        Mood and Affect: Mood normal.        Thought Content: Thought content normal.      UC Treatments / Results  Labs (all labs ordered are listed, but only abnormal results are displayed) Labs Reviewed - No data to display  EKG   Radiology No results found.  Procedures Procedures (including critical care time)  Medications Ordered in UC Medications - No data to display  Initial Impression / Assessment and Plan / UC Course  I have reviewed the triage vital signs and the nursing notes.  Pertinent labs & imaging results that were available during my care of the patient were reviewed by me and considered in my medical  decision making (see chart for details).     Given duration and worsening course, will treat with Augmentin for sinusitis.  Discussed supportive over-the-counter medications, home care and return precautions.  Final Clinical Impressions(s) / UC Diagnoses   Final  diagnoses:  Acute non-recurrent maxillary sinusitis   Discharge Instructions   None    ED Prescriptions     Medication Sig Dispense Auth. Provider   amoxicillin-clavulanate (AUGMENTIN) 875-125 MG tablet Take 1 tablet by mouth every 12 (twelve) hours. 14 tablet Particia Nearing, [place]      PDMP not reviewed this encounter.   Roosvelt Maser [place], [place] 07/26/23 [phone]

## 2023-07-26 NOTE — ED Triage Notes (Signed)
Sinus congestion, productive cough, nausea, weakness x 1.5 weeks

## 2023-08-24 ENCOUNTER — Encounter: Payer: Self-pay | Admitting: "Endocrinology

## 2023-08-24 ENCOUNTER — Encounter: Payer: Self-pay | Admitting: Emergency Medicine

## 2023-08-24 ENCOUNTER — Ambulatory Visit: Payer: PPO | Admitting: "Endocrinology

## 2023-08-24 VITALS — BP 128/80 | HR 60 | Ht 67.0 in | Wt 118.2 lb

## 2023-08-24 DIAGNOSIS — E039 Hypothyroidism, unspecified: Secondary | ICD-10-CM

## 2023-08-24 NOTE — Progress Notes (Signed)
Endocrinology Consult Note                                            08/24/2023, 6:41 PM   Subjective:    Patient ID: Ruth Jennings, female    DOB: Aug 11, 1951, PCP Anabel Halon, MD   Past Medical History:  Diagnosis Date   Anxiety    Hyperlipidemia    Hypothyroidism    Posttraumatic stress disorder 03/01/2013   Past Surgical History:  Procedure Laterality Date   No prior surgery     Social History   Socioeconomic History   Marital status: Married    Spouse name: Not on file   Number of children: 1   Years of education: HS   Highest education level: Not on file  Occupational History   Occupation: Pharmacologist: OTHER    Comment: OWN/EMPLOYED AT [employer]  Tobacco Use   Smoking status: Never   Smokeless tobacco: Never  Vaping Use   Vaping status: Never Used  Substance and Sexual Activity   Alcohol use: No   Drug use: No   Sexual activity: Not Currently  Other Topics Concern   Not on file  Social History Narrative   Not on file   Social Determinants of Health   Financial Resource Strain: Low Risk  (07/11/2022)   Overall Financial Resource Strain (CARDIA)    Difficulty of Paying Living Expenses: Not hard at all  Food Insecurity: No Food Insecurity (08/08/2022)   Received from [facility], [facility]   Hunger Vital Sign    Worried About Running Out of Food in the Last Year: Never true    Ran Out of Food in the Last Year: Never true  Transportation Needs: No Transportation Needs (07/11/2022)   PRAPARE - Administrator, Civil Service (Medical): No    Lack of Transportation (Non-Medical): No  Physical Activity: Sufficiently Active (07/11/2022)   Exercise Vital Sign    Days of Exercise per Week: 6 days    Minutes of Exercise per Session: 30 min  Stress: No Stress Concern Present (07/06/2021)   [employer] of Occupational Health - Occupational Stress Questionnaire    Feeling of Stress : Not at all  Social  Connections: Moderately Integrated (07/11/2022)   Social Connection and Isolation Panel [NHANES]    Frequency of Communication with Friends and Family: More than three times a week    Frequency of Social Gatherings with Friends and Family: More than three times a week    Attends Religious Services: 1 to 4 times per year    Active Member of [employer] or Organizations: No    Attends Banker Meetings: Never    Marital Status: Married   Family History  Problem Relation Age of Onset   Colon cancer Mother    COPD Father    Outpatient Encounter Medications as of 08/24/2023  Medication Sig   levothyroxine (SYNTHROID) 50 MCG tablet Take 1 tablet (50 mcg total) by mouth daily.   alendronate (FOSAMAX) 70 MG tablet TAKE 1 TABLET EVERY WEEK IN THE MORNING 30 MIN BEFORE EATING WITH AN 8OZ GLASS OF WATER (SIT UP 30 MIN)   ALPRAZolam (XANAX) 1 MG tablet Take 1 tablet (1 mg total) by mouth at bedtime as needed for anxiety.   Cholecalciferol 25 MCG (1000 UT)  tablet Take 2 tablets by mouth daily.   cyanocobalamin 1000 MCG tablet Take by mouth.   pravastatin (PRAVACHOL) 40 MG tablet Take 1 tablet (40 mg total) by mouth daily.   [DISCONTINUED] amoxicillin-clavulanate (AUGMENTIN) 875-125 MG tablet Take 1 tablet by mouth every 12 (twelve) hours.   No facility-administered encounter medications on file as of 08/24/2023.   ALLERGIES: Allergies  Allergen Reactions   Prednisone Other (See Comments)    Caused pain to be worse.    VACCINATION STATUS: Immunization History  Administered Date(s) Administered   Fluad Quad(high Dose 65+) 07/07/2021, 08/02/2022   Fluad Trivalent(High Dose 65+) 07/11/2023   Influenza, High Dose Seasonal PF 07/24/2017, 07/31/2019, 08/04/2020   Influenza,inj,quad, With Preservative 07/27/2016, 07/31/2018   Moderna Sars-Covid-2 Vaccination 01/17/2020, 02/14/2020, 10/21/2020   Pneumococcal Conjugate-13 07/27/2016   Pneumococcal Polysaccharide-23 07/24/2017   Zoster  Recombinant(Shingrix) 03/23/2021, 06/04/2021    HPI Ruth Jennings is 72 y.o. female who presents today with a medical history as above. she is being seen in consultation for hypothyroidism requested by Anabel Halon, MD.  She reports that she was diagnosed with hypothyroidism more than 30 years ago.  She has taken various dose of levothyroxine over the years, currently on levothyroxine 50 mcg  p.o. daily before breakfast. She is reporting taking her medication properly. She does not have recent thyroid function test.  In February and July her thyroid function test were consistent with over replacement. She did have palpitations at that time.  Her current pulse rate is 60 bpm.  She also reports some poorly localized left-sided pain along her ribcage.     She reports fluctuating body weight.  She denies dysphagia, shortness of breath, nor voice change.  She reports family history of thyroid dysfunction with one of her siblings.   There is no family history of thyroid malignancy. Her other medical problems include type cholesterol and osteoporosis on treatment.  Her medications include alendronate 70 mg weekly pravastatin 40 mg p.o. nightly.  Review of Systems  Constitutional: +mildly fluctuating body weight , no fatigue, no subjective hyperthermia, no subjective hypothermia Eyes: no blurry vision, no xerophthalmia ENT: no sore throat, no nodules palpated in throat, no dysphagia/odynophagia, no hoarseness Cardiovascular: no Chest Pain, no Shortness of Breath, no palpitations, no leg swelling Respiratory: no cough, no shortness of breath Gastrointestinal: no Nausea/Vomiting/Diarhhea Musculoskeletal: no muscle/joint aches Skin: no rashes Neurological: no tremors, no numbness, no tingling, no dizziness Psychiatric: no depression, no anxiety  Objective:       08/24/2023    2:26 PM 07/26/2023    8:21 AM 05/09/2023    1:19 PM  Vitals with BMI  Height 5\' 7"   5\' 7"   Weight 118 lbs 3 oz  114  lbs 10 oz  BMI 18.51  17.94  Systolic 128 94 113  Diastolic 80 59 64  Pulse 60 78 62    BP 128/80   Pulse 60   Ht 5\' 7"  (1.702 m)   Wt 118 lb 3.2 oz (53.6 kg)   BMI 18.51 kg/m   Wt Readings from Last 3 Encounters:  08/24/23 118 lb 3.2 oz (53.6 kg)  05/09/23 114 lb 9.6 oz (52 kg)  12/05/22 119 lb 3.2 oz (54.1 kg)    Physical Exam  Constitutional:  Body mass index is 18.51 kg/m.,  not in acute distress, normal state of mind Eyes: PERRLA, EOMI, no exophthalmos ENT: moist mucous membranes, no gross thyromegaly, no gross cervical lymphadenopathy Cardiovascular: normal precordial activity, Regular Rate and Rhythm,  no Murmur/Rubs/Gallops Respiratory:  adequate breathing efforts, no gross chest deformity, Clear to auscultation bilaterally Gastrointestinal: abdomen soft, Non -tender, No distension, Bowel Sounds present, no gross organomegaly Musculoskeletal: no gross deformities, strength intact in all four extremities, no peripheral edema Skin: moist, warm, no rashes Neurological: no tremor with outstretched hands, Deep tendon reflexes normal in bilateral lower extremities.  CMP ( most recent) CMP     Component Value Date/Time   NA 142 05/09/2023 1404   K 4.4 05/09/2023 1404   CL 104 05/09/2023 1404   CO2 24 05/09/2023 1404   GLUCOSE 88 05/09/2023 1404   GLUCOSE 105 (H) 08/20/2010 1400   BUN 12 05/09/2023 1404   CREATININE 1.03 (H) 05/09/2023 1404   CALCIUM 9.6 05/09/2023 1404   PROT 6.7 05/09/2023 1404   ALBUMIN 4.4 05/09/2023 1404   AST 19 05/09/2023 1404   ALT 12 05/09/2023 1404   ALKPHOS 70 05/09/2023 1404   BILITOT 0.6 05/09/2023 1404   EGFR 58 (L) 05/09/2023 1404   GFRNONAA >60 08/20/2010 1400    Diabetic Labs (most recent): Lab Results  Component Value Date   HGBA1C 5.7 (H) 05/09/2023     Lipid Panel ( most recent) Lipid Panel     Component Value Date/Time   CHOL 165 05/09/2023 1404   TRIG 97 05/09/2023 1404   HDL 86 05/09/2023 1404   CHOLHDL 1.9  05/09/2023 1404   LDLCALC 62 05/09/2023 1404   LABVLDL 17 05/09/2023 1404     Lab Results  Component Value Date   TSH 0.050 (L) 05/09/2023   TSH 0.026 (L) 12/05/2022   TSH 11.300 (H) 08/02/2022   TSH 0.097 (L) 05/05/2022   TSH 0.172 (L) 12/29/2021   FREET4 1.95 (H) 05/09/2023   FREET4 1.59 12/05/2022   FREET4 1.08 08/02/2022   FREET4 1.56 05/05/2022   FREET4 1.88 (H) 12/29/2021     Assessment & Plan:   1. Hypothyroidism, unspecified type  - Ruth Jennings  is being seen at a kind request of Anabel Halon, MD. - I have reviewed her available thyroid records and clinically evaluated the patient. - Based on these reviews, she has longstanding hypothyroidism on levothyroxine,  however,  there is not sufficient recent information to proceed with definitive treatment plan. She will be sent to lab for TSH, free T4, and antithyroid antibodies measurements.  Until she returns for follow-up visit she is advised to continue on her current dose of levothyroxine 50 mcg p.o. daily before breakfast.   - We discussed about the correct intake of her thyroid hormone, on empty stomach at fasting, with water, separated by at least 30 minutes from breakfast and other medications,  and separated by more than 4 hours from calcium, iron, multivitamins, acid reflux medications (PPIs). -Patient is made aware of the fact that thyroid hormone replacement is needed for life, dose to be adjusted by periodic monitoring of thyroid function tests.  In the absence of any clinical goiter, she will not need repeat thyroid imaging.  Her thyroid ultrasound in July 2024 was consistent with shrunk thyroid with no discrete nodules.   - she is advised to maintain close follow up with Anabel Halon, MD for primary care needs.   -Thank you for involving me in the care of this pleasant patient.  Time spent with the patient: 50  minutes, of which >50% was spent in  counseling her about her hypothyroidism and the rest in  obtaining information about her symptoms, reviewing her previous labs/studies (  including abstractions from other facilities),  evaluations, and treatments,  and developing a plan to confirm diagnosis and long term treatment based on the latest standards of care/guidelines; and documenting her care.  Ruth Jennings participated in the discussions, expressed understanding, and voiced agreement with the above plans.  All questions were answered to her satisfaction. she is encouraged to contact clinic should she have any questions or concerns prior to her return visit.  Follow up plan: Return in about 2 weeks (around 09/07/2023) for Labs Today- Non-Fasting Ok.   Marquis Lunch, MD [facility] [address] [place], [place] [address] Phone: [phone]  Fax: [phone]     08/24/2023, 6:41 PM  This note was partially dictated with voice recognition software. Similar sounding words can be transcribed inadequately or may not  be corrected upon review.

## 2023-08-25 LAB — T4, FREE: Free T4: 1.37 ng/dL (ref 0.82–1.77)

## 2023-08-25 LAB — THYROGLOBULIN ANTIBODY: Thyroglobulin Antibody: 227.6 [IU]/mL — ABNORMAL HIGH (ref 0.0–0.9)

## 2023-08-25 LAB — TSH: TSH: 2.19 u[IU]/mL (ref 0.450–4.500)

## 2023-08-25 LAB — THYROID PEROXIDASE ANTIBODY: Thyroperoxidase Ab SerPl-aCnc: 123 [IU]/mL — ABNORMAL HIGH (ref 0–34)

## 2023-09-04 ENCOUNTER — Ambulatory Visit: Payer: PPO | Admitting: Dermatology

## 2023-09-06 ENCOUNTER — Ambulatory Visit: Payer: PPO | Admitting: "Endocrinology

## 2023-09-07 ENCOUNTER — Encounter: Payer: Self-pay | Admitting: Dermatology

## 2023-09-07 ENCOUNTER — Encounter: Payer: Self-pay | Admitting: Internal Medicine

## 2023-09-07 ENCOUNTER — Ambulatory Visit: Payer: PPO | Admitting: Dermatology

## 2023-09-07 ENCOUNTER — Ambulatory Visit (INDEPENDENT_AMBULATORY_CARE_PROVIDER_SITE_OTHER): Payer: PPO | Admitting: Internal Medicine

## 2023-09-07 ENCOUNTER — Ambulatory Visit: Payer: PPO | Admitting: Internal Medicine

## 2023-09-07 VITALS — BP 104/65 | HR 70 | Ht 67.0 in | Wt 119.8 lb

## 2023-09-07 VITALS — BP 121/76 | HR 68

## 2023-09-07 DIAGNOSIS — M47894 Other spondylosis, thoracic region: Secondary | ICD-10-CM

## 2023-09-07 DIAGNOSIS — D164 Benign neoplasm of bones of skull and face: Secondary | ICD-10-CM | POA: Diagnosis not present

## 2023-09-07 DIAGNOSIS — D169 Benign neoplasm of bone and articular cartilage, unspecified: Secondary | ICD-10-CM

## 2023-09-07 DIAGNOSIS — R202 Paresthesia of skin: Secondary | ICD-10-CM

## 2023-09-07 DIAGNOSIS — G588 Other specified mononeuropathies: Secondary | ICD-10-CM

## 2023-09-07 DIAGNOSIS — E039 Hypothyroidism, unspecified: Secondary | ICD-10-CM

## 2023-09-07 MED ORDER — CLOBETASOL PROPIONATE 0.05 % EX CREA: 1.0000 | TOPICAL_CREAM | Freq: Two times a day (BID) | CUTANEOUS | 1 refills | Status: DC

## 2023-09-07 MED ORDER — AMITRIPTYLINE HCL 10 MG PO TABS: 10.0000 mg | ORAL_TABLET | Freq: Every day | ORAL | 3 refills | Status: DC

## 2023-09-07 NOTE — Patient Instructions (Signed)
Please start taking Amitriptyline as prescribed.  Please continue to take medications as prescribed.  Please continue to follow heart healthy diet and perform moderate exercise/walking at least 150 mins/week.

## 2023-09-07 NOTE — Progress Notes (Signed)
   New Patient Visit   Subjective  Ruth Jennings is a 72 y.o. female who presents for the following: Cyst & Spot Check  Patient states she has cyst and Spot located at the scalp and posterior shoulder that she would like to have examined. Patient reports the areas have been there for several months. She reports the areas are not bothersome.Patient rates irritation 0 out of 10. She states that the areas have not spread. Patient reports she has not previously been treated for these areas. Patient reports Hx of bx. Patient denies family history of skin cancer(s).  Patient reports she has a spot on her posterior Left shoulder she would like to have examined. She states the area has been there for a few weeks. She reports the area is extremely itchy. She rates the are 8 out 10 on itch scale. She has not been treated for this spot by any of her providers.    The following portions of the chart were reviewed this encounter and updated as appropriate: medications, allergies, medical history  Review of Systems:  No other skin or systemic complaints except as noted in HPI or Assessment and Plan.  Objective  Well appearing patient in no apparent distress; mood and affect are within normal limits.   A focused examination was performed of the following areas: Scalp and left posterior shoulder  Relevant exam findings are noted in the Assessment and Plan.          Assessment & Plan   1. Occipital Osteoma - Assessment: Patient presents with a 2-centimeter, firm, bony projection on the occipital scalp. The scalp appears normal, and the lesion is neither mobile nor soft. - Plan: Advise patient to monitor the lesion for any changes or growth. Notify the clinic for further evaluation and possible imaging if changes occur.  2. Notalgia Paresthetica - Assessment: Patient presents with chronic itching and skin changes on the back of the shoulder, consistent with Notalgia Paresthetica, and reports a  history of degenerative changes in the spine. - Plan:   a. Prescribe Clobetasol cream, to be applied twice daily for two weeks, followed by a two-week break, and then resumed for another two weeks. Reevaluate in three months.   b. Recommend daily use of CeraVe Anti-Itch lotion with pramoxine in conjunction with the Clobetasol cream and during the two-week break.   c. Schedule a follow-up appointment in three months for a skin cancer screening and to assess the effectiveness of the treatment plan.    No follow-ups on file.  Documentation: I have reviewed the above documentation for accuracy and completeness, and I agree with the above.  Stasia Cavalier, am acting as scribe for Langston Reusing, DO.   Langston Reusing, DO

## 2023-09-07 NOTE — Patient Instructions (Addendum)
Hello Ms. Kailen,  Thank you for visiting our clinic today. Your dedication to addressing and enhancing your dermatological health is greatly valued.  Here is a summary of the essential instructions from today's consultation:  - Osteoma on Scalp: Currently, no treatment is necessary. We ask that you monitor the area closely. Should you notice any changes or growth, please contact us immediately for further evaluation.  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 After this period, take a two-week break, then repeat the cycle. Continue this regimen for three months.   - CeraVe Anti-Itch Lotion with Pramoxine: Use this lotion daily, especially during the breaks from Clobetasol, to help manage itching.  - Follow-Up: We have scheduled a return visit in three months. This appointment will include a skin cancer screening and an assessment of the treatment's effectiveness for Notalgia Paresthetica.  For your convenience, we have provided samples of CeraVe Anti-Itch lotion. This product can be found in the first aid itching section of stores, easily identifiable by its red labeling.  Should you have any questions or concerns regarding your treatment plan, please do not hesitate to reach out to our office.  Warm regards,  Dr. [facility]  Important Information   Due to recent changes in healthcare laws, you may see results of your pathology and/or laboratory studies on MyChart before the doctors have had a chance to review them. We understand that in some cases there may be results that are confusing or concerning to you. Please understand that not all results are received at the same time and often the doctors may need to interpret multiple results in order to provide you with the best plan of care or course of treatment. Therefore, we ask that you please give [place] 2 business days to thoroughly review all your results before  contacting the office for clarification. Should we see a critical lab result, you will be contacted sooner.     If You Need Anything After Your Visit   If you have any questions or concerns for your doctor, please call our main line at [phone]. If no one answers, please leave a voicemail as directed and we will return your call as soon as possible. Messages left after 4 pm will be answered the following business day.    You may also send [place] a message via MyChart. We typically respond to MyChart messages within 1-2 business days.  For prescription refills, please ask your pharmacy to contact our office. Our fax number is [phone].  If you have an urgent issue when the clinic is closed that cannot wait until the next business day, you can page your doctor at the number below.     Please note that while we do our best to be available for urgent issues outside of office hours, we are not available 24/7.    If you have an urgent issue and are unable to reach [place], you may choose to seek medical care at your doctor's office, retail clinic, urgent care center, or emergency room.   If you have a medical emergency, please immediately call 911 or go to the emergency department. In the event of inclement weather, please call our main line at [phone] for an update on the status of any delays or closures.  Dermatology Medication Tips: Please keep the boxes that topical medications come in in order to help keep track of the instructions about where and how  to use these. Pharmacies typically print the medication instructions only on the boxes and not directly on the medication tubes.   If your medication is too expensive, please contact our office at [phone] or send [place] a message through MyChart.    We are unable to tell what your co-pay for medications will be in advance as this is different depending on your insurance coverage. However, we may be able to find a substitute medication at  lower cost or fill out paperwork to get insurance to cover a needed medication.    If a prior authorization is required to get your medication covered by your insurance company, please allow [place] 1-2 business days to complete this process.   Drug prices often vary depending on where the prescription is filled and some pharmacies may offer cheaper prices.   The website www.goodrx.com contains coupons for medications through different pharmacies. The prices here do not account for what the cost may be with help from insurance (it may be cheaper with your insurance), but the website can give you the price if you did not use any insurance.  - You can print the associated coupon and take it with your prescription to the pharmacy.  - You may also stop by our office during regular business hours and pick up a GoodRx coupon card.  - If you need your prescription sent electronically to a different pharmacy, notify our office through [facility] or by phone at [phone]

## 2023-09-08 NOTE — Assessment & Plan Note (Signed)
Lab Results  Component Value Date   TSH 2.190 08/24/2023   On Levothyroxine 50 mcg QD Labile thyroid function and right-sided throat soreness with odynophagia, checked US thyroid - benign, no suspicious nodules Has been evaluated by Dr. Fransico Him - has follow up to discuss elevated thyroid peroxidase antibody and thyroglobulin antibody

## 2023-09-08 NOTE — Progress Notes (Signed)
Established Patient Office Visit  Subjective:  Patient ID: Ruth Jennings, female    DOB: 1951-06-05  Age: 72 y.o. MRN: [id number]  CC:  Chief Complaint  Patient presents with   Hypothyroidism    Follow up     HPI Ruth Jennings is a 72 y.o. female with past medical history of hypothyroidism, CKD stage 3a, anxiety and HLD who presents for f/u of her chronic medical conditions.  Left sided chest wall pain: Chronic, intermittent, worse at night radiating from LLQ abdomen and thoracic spine area. Associated with palpitations. Has tried Cymbalta, Gabapentin and Lyrica. Has had CT chest and Cardiology evaluation. She was referred to pain clinic in [place], and has had intercostal nerve blocks without any relief.  She has tried physical therapy with minimal relief as well.  She has difficulty sleeping due to severe pain.  Hypothyroidism: Takes Levothyroxine 50 mcg QD. Dose was recently decreased due to low TSH.  She has chronic hair loss concern.  Denies any constipation, diarrhea, recent change in appetite or weight.  She reports right-sided throat soreness and odynophagia.  She has tried warm water gargling without much benefit. US thyroid showed small diffusely heterogeneous and atrophic thyroid gland.  She has been evaluated by endocrinology as well.   Anxiety: Well-controlled with Xanax PRN. Sleep is affected by chest wall pain. Denies any anhedonia, SI or HI.  She reports numbness in the right hand index fingertip, which is constant.  Denies any burning pain.  Denies any wrist pain currently.  Denies any recent injury.  She also reports a bump on the occipital area that she noticed about 2 months ago.  Denies any recent change in size or shape.  Denies any local soreness or itching.   Past Medical History:  Diagnosis Date   Anxiety    Hyperlipidemia    Hypothyroidism    Posttraumatic stress disorder 03/01/2013    Past Surgical History:  Procedure Laterality Date   No prior  surgery      Family History  Problem Relation Age of Onset   Colon cancer Mother    COPD Father     Social History   Socioeconomic History   Marital status: Married    Spouse name: Not on file   Number of children: 1   Years of education: HS   Highest education level: 12th grade  Occupational History   Occupation: Pharmacologist: OTHER    Comment: OWN/EMPLOYED AT [employer]  Tobacco Use   Smoking status: Never    Passive exposure: Never   Smokeless tobacco: Never  Vaping Use   Vaping status: Never Used  Substance and Sexual Activity   Alcohol use: No   Drug use: No   Sexual activity: Not Currently  Other Topics Concern   Not on file  Social History Narrative   Not on file   Social Determinants of Health   Financial Resource Strain: Low Risk  (09/03/2023)   Overall Financial Resource Strain (CARDIA)    Difficulty of Paying Living Expenses: Not hard at all  Food Insecurity: Unknown (09/03/2023)   Hunger Vital Sign    Worried About Running Out of Food in the Last Year: Never true    Ran Out of Food in the Last Year: Patient declined  Transportation Needs: No Transportation Needs (09/03/2023)   PRAPARE - Administrator, Civil Service (Medical): No    Lack of Transportation (Non-Medical): No  Physical Activity: Insufficiently Active (  09/03/2023)   Exercise Vital Sign    Days of Exercise per Week: 2 days    Minutes of Exercise per Session: 10 min  Stress: Patient Declined (09/03/2023)   [employer] of Occupational Health - Occupational Stress Questionnaire    Feeling of Stress : Patient declined  Social Connections: Moderately Integrated (09/03/2023)   Social Connection and Isolation Panel [NHANES]    Frequency of Communication with Friends and Family: Twice a week    Frequency of Social Gatherings with Friends and Family: Once a week    Attends Religious Services: More than 4 times per year    Active Member of [employer] or  Organizations: No    Attends Engineer, structural: Not on file    Marital Status: Married  Intimate Partner Violence: Unknown (05/16/2022)   Received from [employer], [facility]   HITS    Physically Hurt: Not on file    Insult or Talk Down To: Not on file    Threaten Physical Harm: Not on file    Scream or Curse: Not on file    Outpatient Medications Prior to Visit  Medication Sig Dispense Refill   alendronate (FOSAMAX) 70 MG tablet TAKE 1 TABLET EVERY WEEK IN THE MORNING 30 MIN BEFORE EATING WITH AN 8OZ GLASS OF WATER (SIT UP 30 MIN) 12 tablet 3   ALPRAZolam (XANAX) 1 MG tablet Take 1 tablet (1 mg total) by mouth at bedtime as needed for anxiety. 60 tablet 1   Cholecalciferol 25 MCG (1000 UT) tablet Take 2 tablets by mouth daily.     cyanocobalamin 1000 MCG tablet Take by mouth.     levothyroxine (SYNTHROID) 50 MCG tablet Take 1 tablet (50 mcg total) by mouth daily. 90 tablet 1   pravastatin (PRAVACHOL) 40 MG tablet Take 1 tablet (40 mg total) by mouth daily. 90 tablet 3   No facility-administered medications prior to visit.    Allergies  Allergen Reactions   Prednisone Other (See Comments)    Caused pain to be worse.    ROS Review of Systems  Constitutional:  Negative for chills and fever.  HENT:  Negative for congestion, sinus pressure, sinus pain and sore throat.   Eyes:  Negative for pain and discharge.  Respiratory:  Negative for cough and shortness of breath.   Cardiovascular:  Positive for palpitations. Negative for leg swelling.  Gastrointestinal:  Positive for abdominal pain. Negative for diarrhea, nausea and vomiting.  Endocrine: Negative for polydipsia and polyuria.  Genitourinary:  Negative for dysuria and hematuria.  Musculoskeletal:  Negative for neck pain and neck stiffness.  Skin:  Negative for rash.       Cyst of scalp  Neurological:  Negative for dizziness and weakness.  Psychiatric/Behavioral:  Negative for agitation and behavioral  problems.       Objective:    Physical Exam Vitals reviewed.  Constitutional:      General: She is not in acute distress.    Appearance: She is not diaphoretic.  HENT:     Head: Normocephalic and atraumatic.     Nose: Nose normal.     Mouth/Throat:     Mouth: Mucous membranes are moist.  Eyes:     General: No scleral icterus.    Extraocular Movements: Extraocular movements intact.  Cardiovascular:     Rate and Rhythm: Normal rate and regular rhythm.     Heart sounds: Normal heart sounds. No murmur heard. Pulmonary:     Breath sounds: Normal breath sounds. No  wheezing or rales.  Abdominal:     Palpations: Abdomen is soft.     Tenderness: There is no abdominal tenderness.  Musculoskeletal:     Cervical back: Neck supple.     Thoracic back: Tenderness present. Decreased range of motion (Due to pain).     Right lower leg: No edema.     Left lower leg: No edema.  Skin:    General: Skin is warm.     Findings: No rash.     Comments: About 2 cm in diameter bump over scalp area  Neurological:     General: No focal deficit present.     Mental Status: She is alert and oriented to person, place, and time.     Cranial Nerves: No cranial nerve deficit.     Sensory: Sensory deficit (Right index fingertip) present.     Motor: No weakness.  Psychiatric:        Mood and Affect: Mood normal.        Behavior: Behavior normal.     BP 104/65 (BP Location: Left Arm, Patient Position: Sitting, Cuff Size: Normal)   Pulse 70   Ht 5\' 7"  (1.702 m)   Wt 119 lb 12.8 oz (54.3 kg)   SpO2 97%   BMI 18.76 kg/m  Wt Readings from Last 3 Encounters:  09/07/23 119 lb 12.8 oz (54.3 kg)  08/24/23 118 lb 3.2 oz (53.6 kg)  05/09/23 114 lb 9.6 oz (52 kg)    Lab Results  Component Value Date   TSH 2.190 08/24/2023   Lab Results  Component Value Date   WBC 5.2 05/09/2023   HGB 13.7 05/09/2023   HCT 43.1 05/09/2023   MCV 90 05/09/2023   PLT 185 05/09/2023   Lab Results  Component Value  Date   NA 142 05/09/2023   K 4.4 05/09/2023   CO2 24 05/09/2023   GLUCOSE 88 05/09/2023   BUN 12 05/09/2023   CREATININE 1.03 (H) 05/09/2023   BILITOT 0.6 05/09/2023   ALKPHOS 70 05/09/2023   AST 19 05/09/2023   ALT 12 05/09/2023   PROT 6.7 05/09/2023   ALBUMIN 4.4 05/09/2023   CALCIUM 9.6 05/09/2023   EGFR 58 (L) 05/09/2023   Lab Results  Component Value Date   CHOL 165 05/09/2023   Lab Results  Component Value Date   HDL 86 05/09/2023   Lab Results  Component Value Date   LDLCALC 62 05/09/2023   Lab Results  Component Value Date   TRIG 97 05/09/2023   Lab Results  Component Value Date   CHOLHDL 1.9 05/09/2023   Lab Results  Component Value Date   HGBA1C 5.7 (H) 05/09/2023      Assessment & Plan:   Problem List Items Addressed This Visit       Endocrine   Acquired hypothyroidism - Primary    Lab Results  Component Value Date   TSH 2.190 08/24/2023   On Levothyroxine 50 mcg QD Labile thyroid function and right-sided throat soreness with odynophagia, checked US thyroid - benign, no suspicious nodules Has been evaluated by Dr. Fransico Him - has follow up to discuss elevated thyroid peroxidase antibody and thyroglobulin antibody         Musculoskeletal and Integument   Osteoma    Has been evaluated by dermatology, likely benign        Other   Thoracic facet syndrome    Chronic left-sided chest wall pain, could be radiating from thoracic spine Was followed by [facility]  pain clinic Did not get adequate response with gabapentin, Lyrica and Cymbalta Started Elavil 10 mg qHS      Relevant Medications   amitriptyline (ELAVIL) 10 MG tablet   Intercostal neuralgia    Intermittent, appears to radiating from LLQ to anterior axillary border Does not seem to cardiac related pain Unclear etiology Previous imaging studies were insignificant including CT chest Has tried Cymbalta, Gabapentin and Lyrica in the past Had referred to pain clinic - has had  intercoastal nerve block and PT without much relief Started Elavil considering neuropathic pain and/or complex regional pain syndrome      Relevant Medications   amitriptyline (ELAVIL) 10 MG tablet    Meds ordered this encounter  Medications   amitriptyline (ELAVIL) 10 MG tablet    Sig: Take 1 tablet (10 mg total) by mouth at bedtime.    Dispense:  30 tablet    Refill:  3    Follow-up: Return in about 4 months (around 01/05/2024).    Anabel Halon, MD

## 2023-09-08 NOTE — Assessment & Plan Note (Signed)
Chronic left-sided chest wall pain, could be radiating from thoracic spine Was followed by [facility] pain clinic Did not get adequate response with gabapentin, Lyrica and Cymbalta Started Elavil 10 mg qHS

## 2023-09-08 NOTE — Assessment & Plan Note (Signed)
Intermittent, appears to radiating from LLQ to anterior axillary border Does not seem to cardiac related pain Unclear etiology Previous imaging studies were insignificant including CT chest Has tried Cymbalta, Gabapentin and Lyrica in the past Had referred to pain clinic - has had intercoastal nerve block and PT without much relief Started Elavil considering neuropathic pain and/or complex regional pain syndrome

## 2023-09-08 NOTE — Assessment & Plan Note (Signed)
Has been evaluated by dermatology, likely benign

## 2023-09-11 ENCOUNTER — Other Ambulatory Visit: Payer: Self-pay

## 2023-09-13 ENCOUNTER — Ambulatory Visit (INDEPENDENT_AMBULATORY_CARE_PROVIDER_SITE_OTHER): Payer: PPO

## 2023-09-13 VITALS — Ht 67.0 in | Wt 118.0 lb

## 2023-09-13 DIAGNOSIS — Z0000 Encounter for general adult medical examination without abnormal findings: Secondary | ICD-10-CM | POA: Diagnosis not present

## 2023-09-13 NOTE — Progress Notes (Signed)
Because this visit was a virtual/telehealth visit,  certain criteria was not obtained, such a blood pressure, CBG if applicable, and timed get up and go. Any medications not marked as "taking" were not mentioned during the medication reconciliation part of the visit. Any vitals not documented were not able to be obtained due to this being a telehealth visit or patient was unable to self-report a recent blood pressure reading due to a lack of equipment at home via telehealth. Vitals that have been documented are verbally provided by the patient.   Subjective:   Ruth Jennings is a 72 y.o. female who presents for Medicare Annual (Subsequent) preventive examination.  Visit Complete: Virtual I connected with  Ruth Jennings on 09/13/23 by a audio enabled telemedicine application and verified that I am speaking with the correct person using two identifiers.  Patient Location: Home  Provider Location: Home Office  I discussed the limitations of evaluation and management by telemedicine. The patient expressed understanding and agreed to proceed.  Vital Signs: Because this visit was a virtual/telehealth visit, some criteria may be missing or patient reported. Any vitals not documented were not able to be obtained and vitals that have been documented are patient reported.  Patient Medicare AWV questionnaire was completed by the patient on 09/11/2023; I have confirmed that all information answered by patient is correct and no changes since this date.  Cardiac Risk Factors include: advanced age (>59men, >36 women);dyslipidemia     Objective:    Today's Vitals   09/11/23 1532 09/13/23 1145  Weight:  118 lb (53.5 kg)  Height:  5\' 7"  (1.702 m)  PainSc: 6     Body mass index is 18.48 kg/m.     09/13/2023   11:45 AM 07/11/2022    1:49 PM 07/06/2021    2:13 PM  Advanced Directives  Does Patient Have a Medical Advance Directive? No No Yes  Type of Best boy of  [place];Living will  Would patient like information on creating a medical advance directive? No - Patient declined Yes (ED - Information included in AVS)     Current Medications (verified) Outpatient Encounter Medications as of 09/13/2023  Medication Sig   alendronate (FOSAMAX) 70 MG tablet TAKE 1 TABLET EVERY WEEK IN THE MORNING 30 MIN BEFORE EATING WITH AN 8OZ GLASS OF WATER (SIT UP 30 MIN)   ALPRAZolam (XANAX) 1 MG tablet Take 1 tablet (1 mg total) by mouth at bedtime as needed for anxiety.   amitriptyline (ELAVIL) 10 MG tablet Take 1 tablet (10 mg total) by mouth at bedtime.   Cholecalciferol 25 MCG (1000 UT) tablet Take 2 tablets by mouth daily.   clobetasol cream (TEMOVATE) 0.05 % Apply 1 Application topically 2 (two) times daily.   cyanocobalamin 1000 MCG tablet Take by mouth.   levothyroxine (SYNTHROID) 50 MCG tablet Take 1 tablet (50 mcg total) by mouth daily.   pravastatin (PRAVACHOL) 40 MG tablet Take 1 tablet (40 mg total) by mouth daily.   No facility-administered encounter medications on file as of 09/13/2023.    Allergies (verified) Prednisone   History: Past Medical History:  Diagnosis Date   Allergy 2021   Prednisone   Anxiety    Arthritis 2020   Cataract 2022   Hyperlipidemia    Hypothyroidism    Osteoporosis 2022   Posttraumatic stress disorder 03/01/2013   Past Surgical History:  Procedure Laterality Date   No prior surgery     Family History  Problem  Relation Age of Onset   Colon cancer Mother    Cancer Mother    COPD Father    Social History   Socioeconomic History   Marital status: Married    Spouse name: Not on file   Number of children: 1   Years of education: HS   Highest education level: 12th grade  Occupational History   Occupation: Pharmacologist: OTHER    Comment: OWN/EMPLOYED AT [employer]  Tobacco Use   Smoking status: Never    Passive exposure: Never   Smokeless tobacco: Never  Vaping Use   Vaping  status: Never Used  Substance and Sexual Activity   Alcohol use: No   Drug use: No   Sexual activity: Not Currently    Birth control/protection: Post-menopausal  Other Topics Concern   Not on file  Social History Narrative   Not on file   Social Determinants of Health   Financial Resource Strain: Low Risk  (09/11/2023)   Overall Financial Resource Strain (CARDIA)    Difficulty of Paying Living Expenses: Not very hard  Food Insecurity: No Food Insecurity (09/11/2023)   Hunger Vital Sign    Worried About Running Out of Food in the Last Year: Never true    Ran Out of Food in the Last Year: Never true  Transportation Needs: No Transportation Needs (09/11/2023)   PRAPARE - Administrator, Civil Service (Medical): No    Lack of Transportation (Non-Medical): No  Physical Activity: Insufficiently Active (09/11/2023)   Exercise Vital Sign    Days of Exercise per Week: 2 days    Minutes of Exercise per Session: 20 min  Stress: No Stress Concern Present (09/13/2023)   [employer] of Occupational Health - Occupational Stress Questionnaire    Feeling of Stress : Not at all  Social Connections: Moderately Isolated (09/11/2023)   Social Connection and Isolation Panel [NHANES]    Frequency of Communication with Friends and Family: Once a week    Frequency of Social Gatherings with Friends and Family: Once a week    Attends Religious Services: More than 4 times per year    Active Member of [employer] or Organizations: No    Attends Engineer, structural: Never    Marital Status: Married    Tobacco Counseling Counseling given: Yes   Clinical Intake:  Pre-visit preparation completed: Yes  Pain : 0-10 Pain Score: 6  Pain Type: Chronic pain Pain Location:  (patient states its the left side of her body) Pain Orientation: Left Pain Descriptors / Indicators: Constant, Burning, Throbbing, Other (Comment) (stinging) Pain Onset: More than a month ago Pain Frequency:  Constant     Nutritional Status: BMI <19  Underweight Nutritional Risks: None Diabetes: No  How often do you need to have someone help you when you read instructions, pamphlets, or other written materials from your doctor or pharmacy?: 1 - Never  Interpreter Needed?: No  Information entered by :: Abby Ruble Pumphrey, CMA   Activities of Daily Living    09/11/2023    3:32 PM  In your present state of health, do you have any difficulty performing the following activities:  Hearing? 0  Vision? 0  Difficulty concentrating or making decisions? 0  Walking or climbing stairs? 0  Dressing or bathing? 0  Doing errands, shopping? 0  Preparing Food and eating ? N  Using the Toilet? N  In the past six months, have you accidently leaked urine? Y  Do you  have problems with loss of bowel control? N  Managing your Medications? N  Managing your Finances? N  Housekeeping or managing your Housekeeping? N    Patient Care Team: Anabel Halon, MD as PCP - General (Internal Medicine) Jonelle Sidle, MD as PCP - Cardiology (Cardiology)  Indicate any recent Medical Services you may have received from other than [facility] providers in the past year (date may be approximate).     Assessment:   This is a routine wellness examination for Ruth Jennings.  Hearing/Vision screen Hearing Screening - Comments:: Patient denies any hearing difficulties.   Vision Screening - Comments:: Patient is utd with yearly eye exams and sees Dr. Charise Killian w/ [facility] in [place]   Goals Addressed             This Visit's Progress    Patient Stated       Be pain free       Depression Screen    09/13/2023   11:50 AM 09/07/2023    2:55 PM 05/09/2023    1:19 PM 12/05/2022    1:36 PM 08/02/2022    1:27 PM 05/05/2022    1:57 PM 12/29/2021    2:35 PM  PHQ 2/9 Scores  PHQ - 2 Score 0 0 0 0 0 3 0  PHQ- 9 Score 0     9     Fall Risk    09/11/2023    3:32 PM 09/07/2023    2:54 PM 05/09/2023    1:19 PM 12/05/2022     1:35 PM 08/02/2022    1:26 PM  Fall Risk   Falls in the past year? 0 0 0 0 0  Number falls in past yr: 0 0 0 0 0  Injury with Fall? 0 0 0 0 0  Risk for fall due to : No Fall Risks No Fall Risks   No Fall Risks  Follow up Falls prevention discussed Falls evaluation completed   Falls evaluation completed    MEDICARE RISK AT HOME: Medicare Risk at Home Any stairs in or around the home?: Yes If so, are there any without handrails?: No Home free of loose throw rugs in walkways, pet beds, electrical cords, etc?: Yes Adequate lighting in your home to reduce risk of falls?: Yes Life alert?: No Use of a cane, walker or w/c?: No Grab bars in the bathroom?: Yes Shower chair or bench in shower?: No Elevated toilet seat or a handicapped toilet?: No  TIMED UP AND GO:  Was the test performed?  No    Cognitive Function:        09/13/2023   11:49 AM 07/11/2022    2:02 PM 07/06/2021    2:15 PM  6CIT Screen  What Year? 0 points 0 points 0 points  What month? 0 points 0 points 0 points  What time? 0 points 0 points 0 points  Count back from 20 0 points 0 points 0 points  Months in reverse 0 points 0 points 0 points  Repeat phrase 0 points 0 points 10 points  Total Score 0 points 0 points 10 points    Immunizations Immunization History  Administered Date(s) Administered   Fluad Quad(high Dose 65+) 07/07/2021, 08/02/2022   Fluad Trivalent(High Dose 65+) 07/11/2023   Influenza, High Dose Seasonal PF 07/24/2017, 07/31/2019, 08/04/2020   Influenza,inj,quad, With Preservative 07/27/2016, 07/31/2018   Moderna Sars-Covid-2 Vaccination 01/17/2020, 02/14/2020, 10/21/2020   Pneumococcal Conjugate-13 07/27/2016   Pneumococcal Polysaccharide-23 07/24/2017   Zoster Recombinant(Shingrix) 03/23/2021,  06/04/2021    TDAP status: Due, Education has been provided regarding the importance of this vaccine. Advised may receive this vaccine at local pharmacy or Health Dept. Aware to provide a copy of the  vaccination record if obtained from local pharmacy or Health Dept. Verbalized acceptance and understanding.  Flu Vaccine status: Up to date  Pneumococcal vaccine status: Up to date  Covid-19 vaccine status: Information provided on how to obtain vaccines.   Qualifies for Shingles Vaccine? No   Zostavax completed No   Shingrix Completed?: Yes  Screening Tests Health Maintenance  Topic Date Due   DTaP/Tdap/Td (1 - Tdap) Never done   COVID-19 Vaccine (4 - 2023-24 season) 07/02/2023   Medicare Annual Wellness (AWV)  07/12/2023   DEXA SCAN  05/16/2025   MAMMOGRAM  06/21/2025   Colonoscopy  06/03/2026   Pneumonia Vaccine 19+ Years old  Completed   INFLUENZA VACCINE  Completed   Hepatitis C Screening  Completed   Zoster Vaccines- Shingrix  Completed   HPV VACCINES  Aged Out    Health Maintenance  Health Maintenance Due  Topic Date Due   DTaP/Tdap/Td (1 - Tdap) Never done   COVID-19 Vaccine (4 - 2023-24 season) 07/02/2023   Medicare Annual Wellness (AWV)  07/12/2023    Colorectal cancer screening: Type of screening: Colonoscopy. Completed 06/04/2019. Repeat every 7 years  Mammogram status: Completed 06/22/2023. Repeat every year  Bone Density status: Completed 05/17/2023. Results reflect: Bone density results: OSTEOPENIA. Repeat every 2 years.  Lung Cancer Screening: (Low Dose CT Chest recommended if Age 23-80 years, 20 pack-year currently smoking OR have quit w/in 15years.) does not qualify.   Lung Cancer Screening Referral: na  Additional Screening:  Hepatitis C Screening: does not qualify; Completed 08/02/2022  Vision Screening: Recommended annual ophthalmology exams for early detection of glaucoma and other disorders of the eye. Is the patient up to date with their annual eye exam?  Yes  Who is the provider or what is the name of the office in which the patient attends annual eye exams? Dr. Charise Killian If pt is not established with a provider, would they like to be referred to  a provider to establish care? No .   Dental Screening: Recommended annual dental exams for proper oral hygiene  Diabetic Foot Exam: na  Community Resource Referral / Chronic Care Management: CRR required this visit?  No   CCM required this visit?  No     Plan:     I have personally reviewed and noted the following in the patient's chart:   Medical and social history Use of alcohol, tobacco or illicit drugs  Current medications and supplements including opioid prescriptions. Patient is not currently taking opioid prescriptions. Functional ability and status Nutritional status Physical activity Advanced directives List of other physicians Hospitalizations, surgeries, and ER visits in previous 12 months Vitals Screenings to include cognitive, depression, and falls Referrals and appointments  In addition, I have reviewed and discussed with patient certain preventive protocols, quality metrics, and best practice recommendations. A written personalized care plan for preventive services as well as general preventive health recommendations were provided to patient.     Jordan Hawks Laronda Lisby, CMA   09/13/2023   After Visit Summary: (MyChart) Due to this being a telephonic visit, the after visit summary with patients personalized plan was offered to patient via MyChart

## 2023-09-13 NOTE — Patient Instructions (Signed)
[name] , Thank you for taking time to come for your Medicare Wellness Visit. I appreciate your ongoing commitment to your health goals. Please review the following plan we discussed and let me know if I can assist you in the future.   Referrals/Orders/Follow-Ups/Clinician Recommendations:  Next Medicare Annual Wellness Visit: September 16, 2024 at 3:10 pm virtual visit  This is a list of the screening recommended for you and due dates:  Health Maintenance  Topic Date Due   DTaP/Tdap/Td vaccine (1 - Tdap) Never done   COVID-19 Vaccine (4 - 2023-24 season) 07/02/2023   Mammogram  06/21/2024   Medicare Annual Wellness Visit  09/12/2024   DEXA scan (bone density measurement)  05/16/2025   Colon Cancer Screening  06/03/2026   Pneumonia Vaccine  Completed   Flu Shot  Completed   Hepatitis C Screening  Completed   Zoster (Shingles) Vaccine  Completed   HPV Vaccine  Aged Out    Advanced directives: (Declined) Advance directive discussed with you today. Even though you declined this today, please call our office should you change your mind, and we can give you the proper paperwork for you to fill out.  Next Medicare Annual Wellness Visit scheduled for next year: 09/16/2024  Understanding Your Risk for Falls Millions of people have serious injuries from falls each year. It is important to understand your risk of falling. Talk with your health care provider about your risk and what you can do to lower it. If you do have a serious fall, make sure to tell your provider. Falling once raises your risk of falling again. How can falls affect me? Serious injuries from falls are common. These include: Broken bones, such as hip fractures. Head injuries, such as traumatic brain injuries (TBI) or concussions. A fear of falling can cause you to avoid activities and stay at home. This can make your muscles weaker and raise your risk for a fall. What can increase my risk? There are a number of risk  factors that increase your risk for falling. The more risk factors you have, the higher your risk of falling. Serious injuries from a fall happen most often to people who are older than 72 years old. Teenagers and young adults ages 52-29 are also at higher risk. Common risk factors include: Weakness in the lower body. Being generally weak or confused due to long-term (chronic) illness. Dizziness or balance problems. Poor vision. Medicines that cause dizziness or drowsiness. These may include: Medicines for your blood pressure, heart, anxiety, insomnia, or swelling (edema). Pain medicines. Muscle relaxants. Other risk factors include: Drinking alcohol. Having had a fall in the past. Having foot pain or wearing improper footwear. Working at a dangerous job. Having any of the following in your home: Tripping hazards, such as floor clutter or loose rugs. Poor lighting. Pets. Having dementia or memory loss. What actions can I take to lower my risk of falling?     Physical activity Stay physically fit. Do strength and balance exercises. Consider taking a regular class to build strength and balance. Yoga and tai chi are good options. Vision Have your eyes checked every year and your prescription for glasses or contacts updated as needed. Shoes and walking aids Wear non-skid shoes. Wear shoes that have rubber soles and low heels. Do not wear high heels. Do not walk around the house in socks or slippers. Use a cane or walker as told by your provider. Home safety Attach secure railings on both sides of your  stairs. Install grab bars for your bathtub, shower, and toilet. Use a non-skid mat in your bathtub or shower. Attach bath mats securely with double-sided, non-slip rug tape. Use good lighting in all rooms. Keep a flashlight near your bed. Make sure there is a clear path from your bed to the bathroom. Use night-lights. Do not use throw rugs. Make sure all carpeting is taped or tacked  down securely. Remove all clutter from walkways and stairways, including extension cords. Repair uneven or broken steps and floors. Avoid walking on icy or slippery surfaces. Walk on the grass instead of on icy or slick sidewalks. Use ice melter to get rid of ice on walkways in the winter. Use a cordless phone. Questions to ask your health care provider Can you help me check my risk for a fall? Do any of my medicines make me more likely to fall? Should I take a vitamin D supplement? What exercises can I do to improve my strength and balance? Should I make an appointment to have my vision checked? Do I need a bone density test to check for weak bones (osteoporosis)? Would it help to use a cane or a walker? Where to find more information Centers for Disease Control and Prevention, STEADI: TonerPromos.no Community-Based Fall Prevention Programs: TonerPromos.no [employer] on Aging: BaseRingTones.pl Contact a health care provider if: You fall at home. You are afraid of falling at home. You feel weak, drowsy, or dizzy. This information is not intended to replace advice given to you by your health care provider. Make sure you discuss any questions you have with your health care provider. Document Revised: 06/20/2022 Document Reviewed: 06/20/2022 Elsevier Patient Education  2024 [employer]. Preventive Care 65 Years and Older, Female Preventive care refers to lifestyle choices and visits with your health care provider that can promote health and wellness. Preventive care visits are also called wellness exams. What can I expect for my preventive care visit? Counseling Your health care provider may ask you questions about your: Medical history, including: Past medical problems. Family medical history. Pregnancy and menstrual history. History of falls. Current health, including: Memory and ability to understand (cognition). Emotional well-being. Home life and relationship well-being. Sexual  activity and sexual health. Lifestyle, including: Alcohol, nicotine or tobacco, and drug use. Access to firearms. Diet, exercise, and sleep habits. Work and work Astronomer. Sunscreen use. Safety issues such as seatbelt and bike helmet use. Physical exam Your health care provider will check your: Height and weight. These may be used to calculate your BMI (body mass index). BMI is a measurement that tells if you are at a healthy weight. Waist circumference. This measures the distance around your waistline. This measurement also tells if you are at a healthy weight and may help predict your risk of certain diseases, such as type 2 diabetes and high blood pressure. Heart rate and blood pressure. Body temperature. Skin for abnormal spots. What immunizations do I need?  Vaccines are usually given at various ages, according to a schedule. Your health care provider will recommend vaccines for you based on your age, medical history, and lifestyle or other factors, such as travel or where you work. What tests do I need? Screening Your health care provider may recommend screening tests for certain conditions. This may include: Lipid and cholesterol levels. Hepatitis C test. Hepatitis B test. HIV (human immunodeficiency virus) test. STI (sexually transmitted infection) testing, if you are at risk. Lung cancer screening. Colorectal cancer screening. Diabetes screening. This is done by  checking your blood sugar (glucose) after you have not eaten for a while (fasting). Mammogram. Talk with your health care provider about how often you should have regular mammograms. BRCA-related cancer screening. This may be done if you have a family history of breast, ovarian, tubal, or peritoneal cancers. Bone density scan. This is done to screen for osteoporosis. Talk with your health care provider about your test results, treatment options, and if necessary, the need for more tests. Follow these instructions  at home: Eating and drinking  Eat a diet that includes fresh fruits and vegetables, whole grains, lean protein, and low-fat dairy products. Limit your intake of foods with high amounts of sugar, saturated fats, and salt. Take vitamin and mineral supplements as recommended by your health care provider. Do not drink alcohol if your health care provider tells you not to drink. If you drink alcohol: Limit how much you have to 0-1 drink a day. Know how much alcohol is in your drink. In the [place] one drink equals one 12 oz bottle of beer (355 mL), one 5 oz glass of wine (148 mL), or one 1 oz glass of hard liquor (44 mL). Lifestyle Brush your teeth every morning and night with fluoride toothpaste. Floss one time each day. Exercise for at least 30 minutes 5 or more days each week. Do not use any products that contain nicotine or tobacco. These products include cigarettes, chewing tobacco, and vaping devices, such as e-cigarettes. If you need help quitting, ask your health care provider. Do not use drugs. If you are sexually active, practice safe sex. Use a condom or other form of protection in order to prevent STIs. Take aspirin only as told by your health care provider. Make sure that you understand how much to take and what form to take. Work with your health care provider to find out whether it is safe and beneficial for you to take aspirin daily. Ask your health care provider if you need to take a cholesterol-lowering medicine (statin). Find healthy ways to manage stress, such as: Meditation, yoga, or listening to music. Journaling. Talking to a trusted person. Spending time with friends and family. Minimize exposure to UV radiation to reduce your risk of skin cancer. Safety Always wear your seat belt while driving or riding in a vehicle. Do not drive: If you have been drinking alcohol. Do not ride with someone who has been drinking. When you are tired or distracted. While texting. If you  have been using any mind-altering substances or drugs. Wear a helmet and other protective equipment during sports activities. If you have firearms in your house, make sure you follow all gun safety procedures. What's next? Visit your health care provider once a year for an annual wellness visit. Ask your health care provider how often you should have your eyes and teeth checked. Stay up to date on all vaccines. This information is not intended to replace advice given to you by your health care provider. Make sure you discuss any questions you have with your health care provider. Document Revised: 04/14/2021 Document Reviewed: 04/14/2021 Elsevier Patient Education  2024 [employer].

## 2023-09-25 ENCOUNTER — Encounter: Payer: Self-pay | Admitting: "Endocrinology

## 2023-09-25 ENCOUNTER — Ambulatory Visit: Payer: PPO | Admitting: "Endocrinology

## 2023-09-25 VITALS — BP 102/70 | HR 68 | Ht 67.0 in | Wt 121.8 lb

## 2023-09-25 DIAGNOSIS — E063 Autoimmune thyroiditis: Secondary | ICD-10-CM | POA: Diagnosis not present

## 2023-09-25 DIAGNOSIS — E039 Hypothyroidism, unspecified: Secondary | ICD-10-CM

## 2023-09-25 MED ORDER — LEVOTHYROXINE SODIUM 50 MCG PO TABS: 50.0000 ug | ORAL_TABLET | Freq: Every day | ORAL | 1 refills | Status: DC

## 2023-09-25 NOTE — Progress Notes (Signed)
09/25/2023, 4:29 PM   Endocrinology follow-up note  Subjective:    Patient ID: Ruth Jennings, female    DOB: 25-Aug-1951, PCP Anabel Halon, MD   Past Medical History:  Diagnosis Date   Allergy 2021   Prednisone   Anxiety    Arthritis 2020   Cataract 2022   Hyperlipidemia    Hypothyroidism    Osteoporosis 2022   Posttraumatic stress disorder 03/01/2013   Past Surgical History:  Procedure Laterality Date   No prior surgery     Social History   Socioeconomic History   Marital status: Married    Spouse name: Not on file   Number of children: 1   Years of education: HS   Highest education level: 12th grade  Occupational History   Occupation: Pharmacologist: OTHER    Comment: OWN/EMPLOYED AT [employer]  Tobacco Use   Smoking status: Never    Passive exposure: Never   Smokeless tobacco: Never  Vaping Use   Vaping status: Never Used  Substance and Sexual Activity   Alcohol use: No   Drug use: No   Sexual activity: Not Currently    Birth control/protection: Post-menopausal  Other Topics Concern   Not on file  Social History Narrative   Not on file   Social Determinants of Health   Financial Resource Strain: Low Risk  (09/11/2023)   Overall Financial Resource Strain (CARDIA)    Difficulty of Paying Living Expenses: Not very hard  Food Insecurity: No Food Insecurity (09/11/2023)   Hunger Vital Sign    Worried About Running Out of Food in the Last Year: Never true    Ran Out of Food in the Last Year: Never true  Transportation Needs: No Transportation Needs (09/11/2023)   PRAPARE - Administrator, Civil Service (Medical): No    Lack of Transportation (Non-Medical): No  Physical Activity: Insufficiently Active (09/11/2023)   Exercise Vital Sign    Days of Exercise per Week: 2 days    Minutes of Exercise per Session: 20 min  Stress: No Stress Concern Present (09/13/2023)    [employer] of Occupational Health - Occupational Stress Questionnaire    Feeling of Stress : Not at all  Social Connections: Moderately Isolated (09/11/2023)   Social Connection and Isolation Panel [NHANES]    Frequency of Communication with Friends and Family: Once a week    Frequency of Social Gatherings with Friends and Family: Once a week    Attends Religious Services: More than 4 times per year    Active Member of [employer] or Organizations: No    Attends Banker Meetings: Never    Marital Status: Married   Family History  Problem Relation Age of Onset   Colon cancer Mother    Cancer Mother    COPD Father    Outpatient Encounter Medications as of 09/25/2023  Medication Sig   alendronate (FOSAMAX) 70 MG tablet TAKE 1 TABLET EVERY WEEK IN THE MORNING 30 MIN BEFORE EATING WITH AN 8OZ GLASS OF WATER (SIT UP 30 MIN)   ALPRAZolam (XANAX) 1 MG tablet Take 1 tablet (1 mg total) by mouth at bedtime as needed for anxiety.  amitriptyline (ELAVIL) 10 MG tablet Take 1 tablet (10 mg total) by mouth at bedtime.   Cholecalciferol 25 MCG (1000 UT) tablet Take 2 tablets by mouth daily.   clobetasol cream (TEMOVATE) 0.05 % Apply 1 Application topically 2 (two) times daily.   cyanocobalamin 1000 MCG tablet Take by mouth.   levothyroxine (SYNTHROID) 50 MCG tablet Take 1 tablet (50 mcg total) by mouth daily.   pravastatin (PRAVACHOL) 40 MG tablet Take 1 tablet (40 mg total) by mouth daily.   [DISCONTINUED] levothyroxine (SYNTHROID) 50 MCG tablet Take 1 tablet (50 mcg total) by mouth daily.   No facility-administered encounter medications on file as of 09/25/2023.   ALLERGIES: Allergies  Allergen Reactions   Prednisone Other (See Comments)    Caused pain to be worse.    VACCINATION STATUS: Immunization History  Administered Date(s) Administered   Fluad Quad(high Dose 65+) 07/07/2021, 08/02/2022   Fluad Trivalent(High Dose 65+) 07/11/2023   Influenza, High Dose Seasonal PF  07/24/2017, 07/31/2019, 08/04/2020   Influenza,inj,quad, With Preservative 07/27/2016, 07/31/2018   Moderna Sars-Covid-2 Vaccination 01/17/2020, 02/14/2020, 10/21/2020   Pneumococcal Conjugate-13 07/27/2016   Pneumococcal Polysaccharide-23 07/24/2017   Zoster Recombinant(Shingrix) 03/23/2021, 06/04/2021    HPI Ruth Jennings is 72 y.o. female who presents today with a medical history as above. she is being seen in follow-up after she was seen in consultation for hypothyroidism requested by Anabel Halon, MD.  She reports that she was diagnosed with hypothyroidism more than 30 years ago.  She has taken various dose of levothyroxine over the years, currently on levothyroxine 50 mcg  p.o. daily before breakfast. She is reporting taking her medication properly.  Her previsit labs confirm etiology of hypothyroidism to be Hashimoto's thyroiditis. -She has no new complaints today.   She reports fluctuating body weight.  She denies dysphagia, shortness of breath, nor voice change.  She reports family history of thyroid dysfunction with one of her siblings.   There is no family history of thyroid malignancy. Her other medical problems include type cholesterol and osteoporosis on treatment.  Her medications include alendronate 70 mg weekly pravastatin 40 mg p.o. nightly.  Review of Systems  Constitutional: +mildly fluctuating body weight , no fatigue, no subjective hyperthermia, no subjective hypothermia Eyes: no blurry vision, no xerophthalmia   Objective:       09/25/2023    2:25 PM 09/13/2023   11:45 AM 09/07/2023    2:53 PM  Vitals with BMI  Height 5\' 7"  5\' 7"  5\' 7"   Weight 121 lbs 13 oz 118 lbs 119 lbs 13 oz  BMI 19.07 18.48 18.76  Systolic 102 -- 104  Diastolic 70 -- 65  Pulse 68  70    BP 102/70   Pulse 68   Ht 5\' 7"  (1.702 m)   Wt 121 lb 12.8 oz (55.2 kg)   BMI 19.08 kg/m   Wt Readings from Last 3 Encounters:  09/25/23 121 lb 12.8 oz (55.2 kg)  09/13/23 118 lb (53.5 kg)   09/07/23 119 lb 12.8 oz (54.3 kg)    Physical Exam  Constitutional:  Body mass index is 19.08 kg/m.,  not in acute distress, normal state of mind Eyes: PERRLA, EOMI, no exophthalmos ENT: moist mucous membranes, no gross thyromegaly, no gross cervical lymphadenopathy   CMP ( most recent) CMP     Component Value Date/Time   NA 142 05/09/2023 1404   K 4.4 05/09/2023 1404   CL 104 05/09/2023 1404   CO2 24 05/09/2023 1404  GLUCOSE 88 05/09/2023 1404   GLUCOSE 105 (H) 08/20/2010 1400   BUN 12 05/09/2023 1404   CREATININE 1.03 (H) 05/09/2023 1404   CALCIUM 9.6 05/09/2023 1404   PROT 6.7 05/09/2023 1404   ALBUMIN 4.4 05/09/2023 1404   AST 19 05/09/2023 1404   ALT 12 05/09/2023 1404   ALKPHOS 70 05/09/2023 1404   BILITOT 0.6 05/09/2023 1404   EGFR 58 (L) 05/09/2023 1404   GFRNONAA >60 08/20/2010 1400    Diabetic Labs (most recent): Lab Results  Component Value Date   HGBA1C 5.7 (H) 05/09/2023     Lipid Panel ( most recent) Lipid Panel     Component Value Date/Time   CHOL 165 05/09/2023 1404   TRIG 97 05/09/2023 1404   HDL 86 05/09/2023 1404   CHOLHDL 1.9 05/09/2023 1404   LDLCALC 62 05/09/2023 1404   LABVLDL 17 05/09/2023 1404     Lab Results  Component Value Date   TSH 2.190 08/24/2023   TSH 0.050 (L) 05/09/2023   TSH 0.026 (L) 12/05/2022   TSH 11.300 (H) 08/02/2022   TSH 0.097 (L) 05/05/2022   FREET4 1.37 08/24/2023   FREET4 1.95 (H) 05/09/2023   FREET4 1.59 12/05/2022   FREET4 1.08 08/02/2022   FREET4 1.56 05/05/2022     Assessment & Plan:   1. Hypothyroidism-due to Hashimoto's thyroiditis  - Ruth Jennings  is being seen at a kind request of Anabel Halon, MD. - I have reviewed her available thyroid records and clinically evaluated the patient. - Based on these reviews, she has longstanding hypothyroidism now confirmed to be secondary to Hashimoto thyroiditis on levothyroxine.  Her previsit thyroid function tests are consistent with appropriate  replacement.  She is advised to continue levothyroxine 50 mcg p.o. daily before breakfast.  - We discussed about the correct intake of her thyroid hormone, on empty stomach at fasting, with water, separated by at least 30 minutes from breakfast and other medications,  and separated by more than 4 hours from calcium, iron, multivitamins, acid reflux medications (PPIs). -Patient is made aware of the fact that thyroid hormone replacement is needed for life, dose to be adjusted by periodic monitoring of thyroid function tests.   In the absence of any clinical goiter, she will not need repeat thyroid imaging.  Her thyroid ultrasound in July 2024 was consistent with shrunk thyroid with no discrete nodules.   - she is advised to maintain close follow up with Anabel Halon, MD for primary care needs.   I spent  22  minutes in the care of the patient today including review of labs from Thyroid Function, CMP, and other relevant labs ; imaging/biopsy records (current and previous including abstractions from other facilities); face-to-face time discussing  her lab results and symptoms, medications doses, her options of short and long term treatment based on the latest standards of care / guidelines;   and documenting the encounter.  Ruth Jennings  participated in the discussions, expressed understanding, and voiced agreement with the above plans.  All questions were answered to her satisfaction. she is encouraged to contact clinic should she have any questions or concerns prior to her return visit.   Follow up plan: Return in about 6 months (around 03/24/2024) for F/U with Pre-visit Labs.   Marquis Lunch, MD [facility] [address] [place], [place] [address] Phone: [phone]  Fax: [phone]     09/25/2023, 4:29 PM  This note was partially dictated  with voice recognition software. Similar sounding words can be transcribed inadequately or may  not  be corrected upon review.

## 2023-09-27 ENCOUNTER — Other Ambulatory Visit: Payer: Self-pay | Admitting: Internal Medicine

## 2023-09-27 DIAGNOSIS — F411 Generalized anxiety disorder: Secondary | ICD-10-CM

## 2023-09-27 DIAGNOSIS — G4700 Insomnia, unspecified: Secondary | ICD-10-CM

## 2023-10-09 ENCOUNTER — Other Ambulatory Visit: Payer: Self-pay | Admitting: Dermatology

## 2023-10-24 ENCOUNTER — Other Ambulatory Visit: Payer: Self-pay | Admitting: Internal Medicine

## 2023-10-24 DIAGNOSIS — E039 Hypothyroidism, unspecified: Secondary | ICD-10-CM

## 2023-10-28 ENCOUNTER — Other Ambulatory Visit: Payer: Self-pay | Admitting: Internal Medicine

## 2023-10-28 DIAGNOSIS — M810 Age-related osteoporosis without current pathological fracture: Secondary | ICD-10-CM

## 2023-12-07 ENCOUNTER — Ambulatory Visit: Payer: PPO | Admitting: Dermatology

## 2024-01-08 ENCOUNTER — Encounter: Payer: Self-pay | Admitting: Internal Medicine

## 2024-01-08 ENCOUNTER — Ambulatory Visit (INDEPENDENT_AMBULATORY_CARE_PROVIDER_SITE_OTHER): Payer: PPO | Admitting: Internal Medicine

## 2024-01-08 VITALS — BP 111/72 | HR 55 | Ht 67.0 in | Wt 125.6 lb

## 2024-01-08 DIAGNOSIS — R7303 Prediabetes: Secondary | ICD-10-CM | POA: Diagnosis not present

## 2024-01-08 DIAGNOSIS — E039 Hypothyroidism, unspecified: Secondary | ICD-10-CM

## 2024-01-08 DIAGNOSIS — E559 Vitamin D deficiency, unspecified: Secondary | ICD-10-CM

## 2024-01-08 DIAGNOSIS — F411 Generalized anxiety disorder: Secondary | ICD-10-CM | POA: Diagnosis not present

## 2024-01-08 DIAGNOSIS — N1831 Chronic kidney disease, stage 3a: Secondary | ICD-10-CM | POA: Diagnosis not present

## 2024-01-08 DIAGNOSIS — G4700 Insomnia, unspecified: Secondary | ICD-10-CM | POA: Diagnosis not present

## 2024-01-08 DIAGNOSIS — E782 Mixed hyperlipidemia: Secondary | ICD-10-CM | POA: Diagnosis not present

## 2024-01-08 DIAGNOSIS — G588 Other specified mononeuropathies: Secondary | ICD-10-CM

## 2024-01-08 DIAGNOSIS — K5904 Chronic idiopathic constipation: Secondary | ICD-10-CM

## 2024-01-08 MED ORDER — ALPRAZOLAM 1 MG PO TABS: ORAL_TABLET | ORAL | 1 refills | Status: DC

## 2024-01-08 MED ORDER — PRAVASTATIN SODIUM 40 MG PO TABS: 40.0000 mg | ORAL_TABLET | Freq: Every day | ORAL | 3 refills | Status: AC

## 2024-01-08 NOTE — Progress Notes (Addendum)
 Established Patient Office Visit  Subjective:  Patient ID: Ruth Jennings, female    DOB: 02/07/1951  Age: 73 y.o. MRN: [id number]  CC:  Chief Complaint  Patient presents with  . Care Management    4 month f/u    HPI Ruth Jennings is a 73 y.o. female with past medical history of hypothyroidism, CKD stage 3a, anxiety and HLD who presents for f/u of her chronic medical conditions.  Left sided chest wall pain: Chronic, intermittent, worse at night radiating from LLQ abdomen and thoracic spine area. Associated with palpitations. Has tried Cymbalta, Gabapentin  and Lyrica . Has had CT chest and Cardiology evaluation. She was referred to pain clinic in [place], and has had intercostal nerve blocks without any relief.  She has tried physical therapy with minimal relief as well.  She has difficulty sleeping due to severe pain.  Hypothyroidism: Takes Levothyroxine  50 mcg QD. Dose was recently decreased due to low TSH.  She has chronic hair loss concern.  Denies any constipation, diarrhea, recent change in appetite or weight.  US  thyroid  showed small diffusely heterogeneous and atrophic thyroid  gland.  She has been evaluated by endocrinology as well.   Anxiety: Uncontrolled, has had to take Xanax  in the morning on some days for severe anxiety. She takes Xanax  0.5 mg at bedtime for insomnia. Sleep is affected by chest wall pain. Denies any anhedonia, SI or HI.  She reports numbness in the right hand index fingertip, which is constant.  Denies any burning pain.  Denies any wrist pain currently.  Denies any recent injury.   Past Medical History:  Diagnosis Date  . Allergy 2021   Prednisone  . Anxiety   . Arthritis 2020  . Cataract 2022  . Hyperlipidemia   . Hypothyroidism   . Osteoporosis 2022  . Posttraumatic stress disorder 03/01/2013    Past Surgical History:  Procedure Laterality Date  . No prior surgery      Family History  Problem Relation Age of Onset  . Colon cancer Mother    . Cancer Mother   . COPD Father     Social History   Socioeconomic History  . Marital status: Married    Spouse name: Not on file  . Number of children: 1  . Years of education: HS  . Highest education level: 12th grade  Occupational History  . Occupation: Pharmacologist: OTHER    Comment: OWN/EMPLOYED AT [employer]  Tobacco Use  . Smoking status: Never    Passive exposure: Never  . Smokeless tobacco: Never  Vaping Use  . Vaping status: Never Used  Substance and Sexual Activity  . Alcohol use: No  . Drug use: No  . Sexual activity: Not Currently    Birth control/protection: Post-menopausal  Other Topics Concern  . Not on file  Social History Narrative  . Not on file   Social Drivers of Health   Financial Resource Strain: Patient Declined (01/04/2024)   Overall Financial Resource Strain (CARDIA)   . Difficulty of Paying Living Expenses: Patient declined  Food Insecurity: Patient Declined (01/04/2024)   Hunger Vital Sign   . Worried About Programme researcher, broadcasting/film/video in the Last Year: Patient declined   . Ran Out of Food in the Last Year: Patient declined  Transportation Needs: No Transportation Needs (01/04/2024)   PRAPARE - Transportation   . Lack of Transportation (Medical): No   . Lack of Transportation (Non-Medical): No  Physical Activity: Unknown (01/04/2024)  Exercise Vital Sign   . Days of Exercise per Week: 2 days   . Minutes of Exercise per Session: Patient declined  Stress: Patient Declined (01/04/2024)   [employer] of Occupational Health - Occupational Stress Questionnaire   . Feeling of Stress : Patient declined  Social Connections: Unknown (01/04/2024)   Social Connection and Isolation Panel   . Frequency of Communication with Friends and Family: Twice a week   . Frequency of Social Gatherings with Friends and Family: Once a week   . Attends Religious Services: Patient declined   . Active Member of Clubs or Organizations: Yes   . Attends  Banker Meetings: Patient declined   . Marital Status: Married  Catering manager Violence: Not At Risk (09/13/2023)   Humiliation, Afraid, Rape, and Kick questionnaire   . Fear of Current or Ex-Partner: No   . Emotionally Abused: No   . Physically Abused: No   . Sexually Abused: No    Outpatient Medications Prior to Visit  Medication Sig Dispense Refill  . alendronate  (FOSAMAX ) 70 MG tablet TAKE 1 TABLET ONCE A WEEK IN THE MORNING 30 MIN BEFORE EATING WITH AN 8OZ GLASS OF WATER (SIT UP 30 MIN) 12 tablet 3  . Cholecalciferol 25 MCG (1000 UT) tablet Take 2 tablets by mouth daily.    . clobetasol  cream (TEMOVATE ) 0.05 % APPLY ONE APPLICATION TOPICALLY TWICE DAILY 30 g 1  . cyanocobalamin 1000 MCG tablet Take by mouth.    . ALPRAZolam  (XANAX ) 1 MG tablet TAKE ONE TABLET BY MOUTH AT BEDTIME AS NEEDED FOR ANXIETY 60 tablet 1  . levothyroxine  (SYNTHROID ) 50 MCG tablet TAKE ONE TABLET BY MOUTH EVERY DAY 90 tablet 1  . pravastatin  (PRAVACHOL ) 40 MG tablet Take 1 tablet (40 mg total) by mouth daily. 90 tablet 3  . amitriptyline  (ELAVIL ) 10 MG tablet Take 1 tablet (10 mg total) by mouth at bedtime. 30 tablet 3   No facility-administered medications prior to visit.    Allergies  Allergen Reactions  . Prednisone Other (See Comments)    Caused pain to be worse.    ROS Review of Systems  Constitutional:  Negative for chills and fever.  HENT:  Negative for congestion, sinus pressure, sinus pain and sore throat.   Eyes:  Negative for pain and discharge.  Respiratory:  Negative for cough and shortness of breath.   Cardiovascular:  Positive for palpitations. Negative for leg swelling.  Gastrointestinal:  Positive for abdominal pain. Negative for diarrhea, nausea and vomiting.  Endocrine: Negative for polydipsia and polyuria.  Genitourinary:  Negative for dysuria and hematuria.  Musculoskeletal:  Negative for neck pain and neck stiffness.  Skin:  Negative for rash.       Cyst of  scalp  Neurological:  Negative for dizziness and weakness.  Psychiatric/Behavioral:  Negative for agitation and behavioral problems.        Objective:    Physical Exam Vitals reviewed.  Constitutional:      General: She is not in acute distress.    Appearance: She is not diaphoretic.  HENT:     Head: Normocephalic and atraumatic.     Nose: Nose normal.     Mouth/Throat:     Mouth: Mucous membranes are moist.  Eyes:     General: No scleral icterus.    Extraocular Movements: Extraocular movements intact.  Cardiovascular:     Rate and Rhythm: Normal rate and regular rhythm.     Heart sounds: Normal heart sounds. No murmur  heard. Pulmonary:     Breath sounds: Normal breath sounds. No wheezing or rales.  Musculoskeletal:     Cervical back: Neck supple.     Thoracic back: Tenderness present. Decreased range of motion (Due to pain).     Right lower leg: No edema.     Left lower leg: No edema.  Skin:    General: Skin is warm.     Findings: No rash.     Comments: About 2 cm in diameter bump over scalp area  Neurological:     General: No focal deficit present.     Mental Status: She is alert and oriented to person, place, and time.     Cranial Nerves: No cranial nerve deficit.     Sensory: Sensory deficit (Right index fingertip) present.     Motor: No weakness.  Psychiatric:        Mood and Affect: Mood normal.        Behavior: Behavior normal.     BP 111/72   Pulse (!) 55   Ht 5' 7 (1.702 m)   Wt 125 lb 9.6 oz (57 kg)   SpO2 99%   BMI 19.67 kg/m  Wt Readings from Last 3 Encounters:  01/08/24 125 lb 9.6 oz (57 kg)  09/25/23 121 lb 12.8 oz (55.2 kg)  09/13/23 118 lb (53.5 kg)    Lab Results  Component Value Date   TSH 4.540 (H) 01/10/2024   Lab Results  Component Value Date   WBC 4.7 01/10/2024   HGB 14.2 01/10/2024   HCT 43.7 01/10/2024   MCV 92 01/10/2024   PLT 204 01/10/2024   Lab Results  Component Value Date   NA 146 (H) 01/10/2024   K 4.4  01/10/2024   CO2 20 01/10/2024   GLUCOSE 90 01/10/2024   BUN 16 01/10/2024   CREATININE 1.15 (H) 01/10/2024   BILITOT 0.4 01/10/2024   ALKPHOS 91 01/10/2024   AST 28 01/10/2024   ALT 22 01/10/2024   PROT 6.7 01/10/2024   ALBUMIN 4.7 01/10/2024   CALCIUM 10.0 01/10/2024   EGFR 51 (L) 01/10/2024   Lab Results  Component Value Date   CHOL 184 01/10/2024   Lab Results  Component Value Date   HDL 85 01/10/2024   Lab Results  Component Value Date   LDLCALC 81 01/10/2024   Lab Results  Component Value Date   TRIG 100 01/10/2024   Lab Results  Component Value Date   CHOLHDL 2.2 01/10/2024   Lab Results  Component Value Date   HGBA1C 5.5 01/10/2024      Assessment & Plan:   Problem List Items Addressed This Visit       Endocrine   Acquired hypothyroidism - Primary   Lab Results  Component Value Date   TSH 2.190 08/24/2023   On Levothyroxine  50 mcg QD Labile thyroid  function and right-sided throat soreness with odynophagia, checked US  thyroid  - benign, no suspicious nodules Has been evaluated by Dr. Lenis - had follow up to discuss elevated thyroid  peroxidase antibody and thyroglobulin antibody       Relevant Orders   CMP14+EGFR (Completed)   CBC with Differential/Platelet (Completed)   TSH + free T4 (Completed)     Genitourinary   CKD (chronic kidney disease) stage 3, GFR 30-59 ml/min (HCC)   Last CMP showed GFR of 58 and 51 Advised to maintain adequate hydration Avoid nephrotoxic agents        Other   GAD (generalized anxiety disorder) (Chronic)  Uncontrolled with Xanax  1 mg QD PRN, takes it BID sometimes Adjusted Xanax  dose to 1 mg QPM and 0.5 mg QAM Anxiety can also contribute to muscular pain Refilled after reviewing PDMP today      Relevant Medications   ALPRAZolam  (XANAX ) 1 MG tablet   Intercostal neuralgia (Chronic)   Intermittent, appears to radiating from LLQ to anterior axillary border Does not seem to cardiac related pain Unclear  etiology, anxiety/stress worsens it - ?Fibromyalgia Previous imaging studies were insignificant including CT chest Has tried Cymbalta, Gabapentin  and Lyrica  in the past Had referred to pain clinic - has had intercoastal nerve block and PT without much relief Had Started Elavil  considering neuropathic pain and/or complex regional pain syndrome - but did not get any benefit, but had severe constipation      Constipation   Recent worsening of constipation could be due to amitriptyline  and/or worsening of stress (IBS) Increased frequency of Xanax  to address anxiety Advised to take Senokot-S as needed for constipation Maintain adequate hydration      Hyperlipidemia   On statin Check lipid profile      Relevant Medications   pravastatin  (PRAVACHOL ) 40 MG tablet   Other Relevant Orders   Lipid panel (Completed)   Insomnia   Takes Xanax  0.5 mg at bedtime Her intercostal pain interferes with her sleep      Relevant Medications   ALPRAZolam  (XANAX ) 1 MG tablet   Vitamin D  deficiency   Relevant Orders   VITAMIN D  25 Hydroxy (Vit-D Deficiency, Fractures) (Completed)   Other Visit Diagnoses       Prediabetes       Relevant Orders   Hemoglobin A1c (Completed)   CMP14+EGFR (Completed)       Meds ordered this encounter  Medications  . ALPRAZolam  (XANAX ) 1 MG tablet    Sig: Take 1 tablet (1 mg total) by mouth at bedtime AND 0.5 tablets (0.5 mg total) in the morning.    Dispense:  135 tablet    Refill:  1  . pravastatin  (PRAVACHOL ) 40 MG tablet    Sig: Take 1 tablet (40 mg total) by mouth daily.    Dispense:  90 tablet    Refill:  3    Follow-up: Return in about 4 months (around 05/09/2024) for Annual physical (after 05/09/24).    Suzzane MARLA Blanch, MD

## 2024-01-08 NOTE — Assessment & Plan Note (Signed)
 Lab Results  Component Value Date   TSH 2.190 08/24/2023   On Levothyroxine 50 mcg QD Labile thyroid function and right-sided throat soreness with odynophagia, checked US thyroid - benign, no suspicious nodules Has been evaluated by Dr. Fransico Him - had follow up to discuss elevated thyroid peroxidase antibody and thyroglobulin antibody

## 2024-01-08 NOTE — Assessment & Plan Note (Addendum)
 Uncontrolled with Xanax  1 mg QD PRN, takes it BID sometimes Adjusted Xanax  dose to 1 mg QPM and 0.5 mg QAM Anxiety can also contribute to muscular pain Refilled after reviewing PDMP today

## 2024-01-08 NOTE — Assessment & Plan Note (Signed)
 On statin Check lipid profile

## 2024-01-08 NOTE — Assessment & Plan Note (Signed)
 Takes Xanax as needed

## 2024-01-08 NOTE — Patient Instructions (Signed)
 Please start taking Xanax 1/2 tablet during daytime in addition to 1 tablet at nighttime.  Please take Senokot-S up to twice daily. Okay to take Miralax as needed for persistent constipation.  Please continue to take other medications as prescribed.  Please continue to follow low carb diet and perform moderate exercise/walking at least 150 mins/week.  Please get fasting blood tests done before the next visit.

## 2024-01-10 DIAGNOSIS — E559 Vitamin D deficiency, unspecified: Secondary | ICD-10-CM | POA: Diagnosis not present

## 2024-01-10 DIAGNOSIS — E782 Mixed hyperlipidemia: Secondary | ICD-10-CM | POA: Diagnosis not present

## 2024-01-10 DIAGNOSIS — R7303 Prediabetes: Secondary | ICD-10-CM | POA: Diagnosis not present

## 2024-01-10 DIAGNOSIS — E039 Hypothyroidism, unspecified: Secondary | ICD-10-CM | POA: Diagnosis not present

## 2024-01-11 LAB — LIPID PANEL: Chol/HDL Ratio: 2.2 ratio (ref 0.0–4.4)

## 2024-01-12 LAB — CMP14+EGFR
ALT: 22 IU/L (ref 0–32)
AST: 28 IU/L (ref 0–40)
Albumin: 4.7 g/dL (ref 3.8–4.8)
Alkaline Phosphatase: 91 IU/L (ref 44–121)
BUN/Creatinine Ratio: 14 (ref 12–28)
BUN: 16 mg/dL (ref 8–27)
Bilirubin Total: 0.4 mg/dL (ref 0.0–1.2)
CO2: 20 mmol/L (ref 20–29)
Calcium: 10 mg/dL (ref 8.7–10.3)
Chloride: 104 mmol/L (ref 96–106)
Creatinine, Ser: 1.15 mg/dL — ABNORMAL HIGH (ref 0.57–1.00)
Globulin, Total: 2 g/dL (ref 1.5–4.5)
Glucose: 90 mg/dL (ref 70–99)
Potassium: 4.4 mmol/L (ref 3.5–5.2)
Sodium: 146 mmol/L — ABNORMAL HIGH (ref 134–144)
Total Protein: 6.7 g/dL (ref 6.0–8.5)
eGFR: 51 mL/min/{1.73_m2} — ABNORMAL LOW (ref >59)

## 2024-01-12 LAB — HEMOGLOBIN A1C
Est. average glucose Bld gHb Est-mCnc: 111 mg/dL
Hgb A1c MFr Bld: 5.5 % (ref 4.8–5.6)

## 2024-01-12 LAB — CBC WITH DIFFERENTIAL/PLATELET
Basophils Absolute: 0 10*3/uL (ref 0.0–0.2)
Basos: 1 %
EOS (ABSOLUTE): 0.1 10*3/uL (ref 0.0–0.4)
Eos: 3 %
Hematocrit: 43.7 % (ref 34.0–46.6)
Hemoglobin: 14.2 g/dL (ref 11.1–15.9)
Immature Grans (Abs): 0 10*3/uL (ref 0.0–0.1)
Immature Granulocytes: 0 %
Lymphocytes Absolute: 1.4 10*3/uL (ref 0.7–3.1)
Lymphs: 31 %
MCH: 30 pg (ref 26.6–33.0)
MCHC: 32.5 g/dL (ref 31.5–35.7)
MCV: 92 fL (ref 79–97)
Monocytes Absolute: 0.4 10*3/uL (ref 0.1–0.9)
Monocytes: 8 %
Neutrophils Absolute: 2.7 10*3/uL (ref 1.4–7.0)
Neutrophils: 57 %
Platelets: 204 10*3/uL (ref 150–450)
RBC: 4.73 x10E6/uL (ref 3.77–5.28)
RDW: 12.5 % (ref 11.7–15.4)
WBC: 4.7 10*3/uL (ref 3.4–10.8)

## 2024-01-12 LAB — LIPID PANEL
Cholesterol, Total: 184 mg/dL (ref 100–199)
HDL: 85 mg/dL (ref >39)
LDL CALC COMMENT:: 2.2 ratio (ref 0.0–4.4)
LDL Chol Calc (NIH): 81 mg/dL (ref 0–99)
Triglycerides: 100 mg/dL (ref 0–149)
VLDL Cholesterol Cal: 18 mg/dL (ref 5–40)

## 2024-01-12 LAB — TSH+FREE T4
Free T4: 1.29 ng/dL (ref 0.82–1.77)
TSH: 4.54 u[IU]/mL — ABNORMAL HIGH (ref 0.450–4.500)

## 2024-01-12 LAB — VITAMIN D 25 HYDROXY (VIT D DEFICIENCY, FRACTURES): Vit D, 25-Hydroxy: 47.6 ng/mL (ref 30.0–100.0)

## 2024-01-12 NOTE — Assessment & Plan Note (Signed)
 Intermittent, appears to radiating from LLQ to anterior axillary border Does not seem to cardiac related pain Unclear etiology, anxiety/stress worsens it - ?Fibromyalgia Previous imaging studies were insignificant including CT chest Has tried Cymbalta, Gabapentin and Lyrica in the past Had referred to pain clinic - has had intercoastal nerve block and PT without much relief Had Started Elavil considering neuropathic pain and/or complex regional pain syndrome - but did not get any benefit, but had severe constipation

## 2024-01-12 NOTE — Assessment & Plan Note (Signed)
 Last CMP showed GFR of 58 and 51 Advised to maintain adequate hydration Avoid nephrotoxic agents

## 2024-01-12 NOTE — Assessment & Plan Note (Signed)
 Recent worsening of constipation could be due to amitriptyline and/or worsening of stress (IBS) Increased frequency of Xanax to address anxiety Advised to take Senokot-S as needed for constipation Maintain adequate hydration

## 2024-03-26 ENCOUNTER — Ambulatory Visit: Payer: PPO | Admitting: "Endocrinology

## 2024-04-09 ENCOUNTER — Ambulatory Visit
Admission: EM | Admit: 2024-04-09 | Discharge: 2024-04-09 | Disposition: A | Discharge status: Home / Self Care | Attending: Family Medicine | Admitting: Family Medicine

## 2024-04-09 DIAGNOSIS — H01005 Unspecified blepharitis left lower eyelid: Secondary | ICD-10-CM

## 2024-04-09 MED ORDER — ERYTHROMYCIN 5 MG/GM OP OINT: TOPICAL_OINTMENT | OPHTHALMIC | 0 refills | Status: DC

## 2024-04-09 NOTE — ED Triage Notes (Signed)
 Pt reports left eye infection x 1 week. Swelling and redness present pt has tried both warm and cold compresses, feels a knot in the corner of the left eye.

## 2024-04-09 NOTE — ED Provider Notes (Signed)
 [facility]    CSN: [id number] Arrival date & time: 04/09/24  1149      History   Chief Complaint No chief complaint on file.   HPI Ruth Jennings is a 73 y.o. female.   Patient presenting today with 1 week history of swelling, redness, discomfort to the left lower eyelid.  States she feels a knot in the corner of the left eye lid.  Denies eye redness or drainage, visual change, headache, fever, injury to the eye.  Trying warm and cold compresses with minimal relief.    Past Medical History:  Diagnosis Date   Allergy 2021   Prednisone   Anxiety    Arthritis 2020   Cataract 2022   Hyperlipidemia    Hypothyroidism    Osteoporosis 2022   Posttraumatic stress disorder 03/01/2013    Patient Active Problem List   Diagnosis Date Noted   Thyromegaly 05/09/2023   Osteoma 05/09/2023   Intercostal neuralgia 08/02/2022   Degeneration, intervertebral disc, lumbar 07/19/2022   Thoracic facet syndrome 07/19/2022   Thoracic spondylosis 06/13/2022   Encounter for general adult medical examination with abnormal findings 05/05/2022   Chronic midline thoracic back pain 09/01/2021   GAD (generalized anxiety disorder) 09/01/2021   Carpal tunnel syndrome of right wrist 09/01/2021   Osteoporosis without current pathological fracture 02/05/2021   Constipation 01/27/2021   Diverticulosis of small intestine 01/27/2021   Family history of malignant neoplasm of gastrointestinal tract 01/27/2021   History of colonic polyps 01/27/2021   Heart palpitations 01/12/2021   Left-sided chest wall pain 01/12/2021   Chronic left shoulder pain 04/10/2019   CKD (chronic kidney disease) stage 3, GFR 30-59 ml/min (HCC) 01/26/2017   Acquired hypothyroidism 01/13/2016   Cataplexy 01/13/2016   Hyperlipidemia 01/13/2016   Insomnia 01/13/2016   Vitamin D  deficiency 01/13/2016   Adjustment disorder with anxiety 01/13/2016    Past Surgical History:  Procedure Laterality Date   No prior  surgery      OB History   No obstetric history on file.      Home Medications    Prior to Admission medications   Medication Sig Start Date End Date Taking? Authorizing Provider  erythromycin ophthalmic ointment Place a 1/2 inch ribbon of ointment into the left lower eyelid BID prn. 04/09/24  Yes Corbin Dess, PA-C  alendronate  (FOSAMAX ) 70 MG tablet TAKE 1 TABLET ONCE A WEEK IN THE MORNING 30 MIN BEFORE EATING WITH AN 8OZ GLASS OF WATER (SIT UP 30 MIN) 10/30/23   Meldon Sport, MD  ALPRAZolam  (XANAX ) 1 MG tablet Take 1 tablet (1 mg total) by mouth at bedtime AND 0.5 tablets (0.5 mg total) in the morning. 01/08/24   Meldon Sport, MD  Cholecalciferol 25 MCG (1000 UT) tablet Take 2 tablets by mouth daily.    [provider]  clobetasol  cream (TEMOVATE ) 0.05 % APPLY ONE APPLICATION TOPICALLY TWICE DAILY 10/09/23   Dellar Fenton, DO  cyanocobalamin 1000 MCG tablet Take by mouth.    [provider]  levothyroxine  (SYNTHROID ) 50 MCG tablet TAKE ONE TABLET BY MOUTH EVERY DAY 10/24/23   Meldon Sport, MD  pravastatin  (PRAVACHOL ) 40 MG tablet Take 1 tablet (40 mg total) by mouth daily. 01/08/24   Meldon Sport, MD    Family History Family History  Problem Relation Age of Onset   Colon cancer Mother    Cancer Mother    COPD Father     Social History Social History  Tobacco Use   Smoking status: Never    Passive exposure: Never   Smokeless tobacco: Never  Vaping Use   Vaping status: Never Used  Substance Use Topics   Alcohol use: No   Drug use: No     Allergies   Prednisone   Review of Systems Review of Systems PER HPI  Physical Exam Triage Vital Signs ED Triage Vitals  Encounter Vitals Group     BP 04/09/24 1234 126/73     Systolic BP Percentile --      Diastolic BP Percentile --      Pulse Rate 04/09/24 1234 60     Resp 04/09/24 1234 18     Temp 04/09/24 1234 98.7 F (37.1 C)     Temp Source 04/09/24 1234 Oral     SpO2  04/09/24 1234 99 %     Weight --      Height --      Head Circumference --      Peak Flow --      Pain Score 04/09/24 1233 0     Pain Loc --      Pain Education --      Exclude from Growth Chart --    No data found.  Updated Vital Signs BP 126/73 (BP Location: Right Arm)   Pulse 60   Temp 98.7 F (37.1 C) (Oral)   Resp 18   SpO2 99%   Visual Acuity Right Eye Distance:   Left Eye Distance:   Bilateral Distance:    Right Eye Near:   Left Eye Near:    Bilateral Near:     Physical Exam Vitals and nursing note reviewed.  Constitutional:      Appearance: Normal appearance. She is not ill-appearing.  HENT:     Head: Atraumatic.  Eyes:     Extraocular Movements: Extraocular movements intact.     Pupils: Pupils are equal, round, and reactive to light.     Comments: Left lower eyelid erythematous, edematous, mildly tender to palpation.  Bilateral conjunctiva benign  Cardiovascular:     Rate and Rhythm: Normal rate.  Pulmonary:     Effort: Pulmonary effort is normal.  Musculoskeletal:        General: Normal range of motion.     Cervical back: Normal range of motion and neck supple.  Skin:    General: Skin is warm and dry.  Neurological:     Mental Status: She is alert and oriented to person, place, and time.  Psychiatric:        Mood and Affect: Mood normal.        Thought Content: Thought content normal.        Judgment: Judgment normal.    UC Treatments / Results  Labs (all labs ordered are listed, but only abnormal results are displayed) Labs Reviewed - No data to display  EKG   Radiology No results found.  Procedures Procedures (including critical care time)  Medications Ordered in UC Medications - No data to display  Initial Impression / Assessment and Plan / UC Course  I have reviewed the triage vital signs and the nursing notes.  Pertinent labs & imaging results that were available during my care of the patient were reviewed by me and considered  in my medical decision making (see chart for details).     Will treat with erythromycin ointment, warm compresses, over-the-counter pain relievers as needed.  Follow-up for worsening symptoms.  Visual acuity deferred as vision intact  per patient.  Final Clinical Impressions(s) / UC Diagnoses   Final diagnoses:  Blepharitis of left lower eyelid, unspecified type   Discharge Instructions   None    ED Prescriptions     Medication Sig Dispense Auth. Provider   erythromycin ophthalmic ointment Place a 1/2 inch ribbon of ointment into the left lower eyelid BID prn. 3.5 g Corbin Dess, PA-C      PDMP not reviewed this encounter.   Corbin Dess, [place] 04/09/24 1418

## 2024-04-20 ENCOUNTER — Other Ambulatory Visit: Payer: Self-pay | Admitting: Internal Medicine

## 2024-04-20 DIAGNOSIS — E039 Hypothyroidism, unspecified: Secondary | ICD-10-CM

## 2024-04-23 DIAGNOSIS — H04123 Dry eye syndrome of bilateral lacrimal glands: Secondary | ICD-10-CM | POA: Diagnosis not present

## 2024-05-09 DIAGNOSIS — H0015 Chalazion left lower eyelid: Secondary | ICD-10-CM | POA: Diagnosis not present

## 2024-05-09 DIAGNOSIS — H16223 Keratoconjunctivitis sicca, not specified as Sjogren's, bilateral: Secondary | ICD-10-CM | POA: Diagnosis not present

## 2024-05-09 DIAGNOSIS — H01002 Unspecified blepharitis right lower eyelid: Secondary | ICD-10-CM | POA: Diagnosis not present

## 2024-05-09 DIAGNOSIS — H2513 Age-related nuclear cataract, bilateral: Secondary | ICD-10-CM | POA: Diagnosis not present

## 2024-05-09 DIAGNOSIS — H01005 Unspecified blepharitis left lower eyelid: Secondary | ICD-10-CM | POA: Diagnosis not present

## 2024-05-09 DIAGNOSIS — H01004 Unspecified blepharitis left upper eyelid: Secondary | ICD-10-CM | POA: Diagnosis not present

## 2024-05-09 DIAGNOSIS — H01001 Unspecified blepharitis right upper eyelid: Secondary | ICD-10-CM | POA: Diagnosis not present

## 2024-05-14 ENCOUNTER — Telehealth: Payer: Self-pay

## 2024-05-14 NOTE — Telephone Encounter (Signed)
 Copied from CRM [id number]. Topic: Clinical - Medication Question >> May 14, 2024  1:49 PM Everette C wrote: Reason for CRM: Diona with [employer] has called for an updated and verified prescription/directions for the patient's prescription of ALPRAZolam  (XANAX ) 1 MG tablet [[id number]]  Please contact Marydeth further if/when possible to confirm

## 2024-05-20 ENCOUNTER — Encounter: Payer: Self-pay | Admitting: Internal Medicine

## 2024-05-20 ENCOUNTER — Ambulatory Visit (INDEPENDENT_AMBULATORY_CARE_PROVIDER_SITE_OTHER): Admitting: Internal Medicine

## 2024-05-20 VITALS — BP 116/70 | HR 61 | Ht 65.5 in | Wt 123.0 lb

## 2024-05-20 DIAGNOSIS — E782 Mixed hyperlipidemia: Secondary | ICD-10-CM | POA: Diagnosis not present

## 2024-05-20 DIAGNOSIS — E039 Hypothyroidism, unspecified: Secondary | ICD-10-CM | POA: Diagnosis not present

## 2024-05-20 DIAGNOSIS — Z0001 Encounter for general adult medical examination with abnormal findings: Secondary | ICD-10-CM

## 2024-05-20 DIAGNOSIS — F411 Generalized anxiety disorder: Secondary | ICD-10-CM | POA: Diagnosis not present

## 2024-05-20 DIAGNOSIS — N1831 Chronic kidney disease, stage 3a: Secondary | ICD-10-CM

## 2024-05-20 DIAGNOSIS — M810 Age-related osteoporosis without current pathological fracture: Secondary | ICD-10-CM

## 2024-05-20 DIAGNOSIS — G588 Other specified mononeuropathies: Secondary | ICD-10-CM | POA: Diagnosis not present

## 2024-05-20 NOTE — Assessment & Plan Note (Signed)
 Better controlled with Xanax  dose to 1 mg QPM and 0.5 mg QAM Anxiety can also contribute to muscular pain Refilled after reviewing PDMP today

## 2024-05-20 NOTE — Patient Instructions (Signed)
Please continue to take medications as prescribed.  Please continue to follow low carb diet and perform moderate exercise/walking as tolerated.  Please consider getting Tdap vaccine at local pharmacy.

## 2024-05-20 NOTE — Assessment & Plan Note (Signed)
 Physical exam as documented. Counseling done  re healthy lifestyle involving commitment to 150 minutes exercise per week, heart healthy diet, and attaining healthy weight.The importance of adequate sleep also discussed. Immunization and cancer screening needs are specifically addressed at this visit.

## 2024-05-20 NOTE — Assessment & Plan Note (Signed)
 Last 2 CMPs showed GFR of 58 and 51 Advised to maintain adequate hydration Avoid nephrotoxic agents

## 2024-05-20 NOTE — Assessment & Plan Note (Addendum)
 On Alendronate  (since 08/22) Continue calcium and Vitamin D  supplements

## 2024-05-20 NOTE — Assessment & Plan Note (Addendum)
On pravastatin Checked lipid profile

## 2024-05-20 NOTE — Assessment & Plan Note (Addendum)
 Intermittent, appears to radiating from LLQ to anterior axillary border Does not seem to be cardiac related pain Unclear etiology, anxiety/stress worsens it - ?Fibromyalgia Previous imaging studies were insignificant including CT chest Has tried Cymbalta, Elavil , Gabapentin  and Lyrica  in the past Had referred to pain clinic - has had intercoastal nerve block and PT without much relief Referred to PM&R clinic

## 2024-05-20 NOTE — Assessment & Plan Note (Signed)
 Lab Results  Component Value Date   TSH 4.540 (H) 01/10/2024   On Levothyroxine  50 mcg QD Labile thyroid  function and right-sided throat soreness with odynophagia, checked US  thyroid  - benign, no suspicious nodules Has been evaluated by Dr. Lenis

## 2024-05-20 NOTE — Progress Notes (Signed)
 Established Patient Office Visit  Subjective:  Patient ID: Ruth Jennings, female    DOB: 1951/09/21  Age: 73 y.o. MRN: [id number]  CC:  Chief Complaint  Patient presents with   Annual Exam    Cpe.     HPI Ruth Jennings is a 73 y.o. female with past medical history of hypothyroidism, CKD stage 3a, anxiety and HLD who presents for f/u of her chronic medical conditions.  Left sided chest wall pain: Chronic, intermittent, worse at night radiating from LLQ abdomen and thoracic spine area. Associated with palpitations. Has tried Cymbalta, Elavil , Gabapentin  and Lyrica . Has had CT chest and Cardiology evaluation. She was referred to pain clinic in [place], and has had intercostal nerve blocks without any relief.  She has tried physical therapy with minimal relief as well.  She has difficulty sleeping due to severe pain.  Hypothyroidism: Takes Levothyroxine  50 mcg QD. She has chronic hair loss concern.  Denies any constipation, diarrhea, recent change in appetite or weight.  US  thyroid  showed small diffusely heterogeneous and atrophic thyroid  gland.  She has been evaluated by endocrinology as well.  Anxiety: Better controlled, has had to take Xanax  in the morning on some days for severe anxiety. She takes Xanax  1 mg at bedtime for insomnia. Sleep is affected by chest wall pain. Denies any anhedonia, SI or HI.  She reports numbness in the right hand index fingertip, which is constant.  Denies any burning pain.  Denies any wrist pain currently.  Denies any recent injury.   Past Medical History:  Diagnosis Date   Allergy 2021   Prednisone   Anxiety    Arthritis 2020   Cataract 2022   Hyperlipidemia    Hypothyroidism    Osteoporosis 2022   Posttraumatic stress disorder 03/01/2013    Past Surgical History:  Procedure Laterality Date   No prior surgery      Family History  Problem Relation Age of Onset   Colon cancer Mother    Cancer Mother    COPD Father     Social History    Socioeconomic History   Marital status: Married    Spouse name: Not on file   Number of children: 1   Years of education: HS   Highest education level: Never attended school  Occupational History   Occupation: Pharmacologist: OTHER    Comment: OWN/EMPLOYED AT [employer]  Tobacco Use   Smoking status: Never    Passive exposure: Never   Smokeless tobacco: Never  Vaping Use   Vaping status: Never Used  Substance and Sexual Activity   Alcohol use: No   Drug use: No   Sexual activity: Not Currently    Birth control/protection: Post-menopausal  Other Topics Concern   Not on file  Social History Narrative   Not on file   Social Drivers of Health   Financial Resource Strain: Low Risk  (05/16/2024)   Overall Financial Resource Strain (CARDIA)    Difficulty of Paying Living Expenses: Not very hard  Food Insecurity: No Food Insecurity (05/16/2024)   Hunger Vital Sign    Worried About Running Out of Food in the Last Year: Never true    Ran Out of Food in the Last Year: Never true  Transportation Needs: No Transportation Needs (05/16/2024)   PRAPARE - Administrator, Civil Service (Medical): No    Lack of Transportation (Non-Medical): No  Physical Activity: Unknown (05/16/2024)   Exercise Vital Sign  Days of Exercise per Week: Patient declined    Minutes of Exercise per Session: Not on file  Stress: Patient Declined (05/16/2024)   [employer] of Occupational Health - Occupational Stress Questionnaire    Feeling of Stress: Patient declined  Social Connections: Unknown (05/16/2024)   Social Connection and Isolation Panel    Frequency of Communication with Friends and Family: Once a week    Frequency of Social Gatherings with Friends and Family: Patient declined    Attends Religious Services: More than 4 times per year    Active Member of [employer] or Organizations: No    Attends Engineer, structural: Not on file    Marital Status:  Married  Catering manager Violence: Not At Risk (09/13/2023)   Humiliation, Afraid, Rape, and Kick questionnaire    Fear of Current or Ex-Partner: No    Emotionally Abused: No    Physically Abused: No    Sexually Abused: No    Outpatient Medications Prior to Visit  Medication Sig Dispense Refill   alendronate  (FOSAMAX ) 70 MG tablet TAKE 1 TABLET ONCE A WEEK IN THE MORNING 30 MIN BEFORE EATING WITH AN 8OZ GLASS OF WATER (SIT UP 30 MIN) 12 tablet 3   ALPRAZolam  (XANAX ) 1 MG tablet Take 1 tablet (1 mg total) by mouth at bedtime AND 0.5 tablets (0.5 mg total) in the morning. 135 tablet 1   Cholecalciferol 25 MCG (1000 UT) tablet Take 2 tablets by mouth daily.     cyanocobalamin 1000 MCG tablet Take by mouth.     erythromycin  ophthalmic ointment Place a 1/2 inch ribbon of ointment into the left lower eyelid BID prn. 3.5 g 0   levothyroxine  (SYNTHROID ) 50 MCG tablet TAKE ONE TABLET BY MOUTH EVERY DAY 90 tablet 1   pravastatin  (PRAVACHOL ) 40 MG tablet Take 1 tablet (40 mg total) by mouth daily. 90 tablet 3   clobetasol  cream (TEMOVATE ) 0.05 % APPLY ONE APPLICATION TOPICALLY TWICE DAILY 30 g 1   No facility-administered medications prior to visit.    Allergies  Allergen Reactions   Prednisone Other (See Comments)    Caused pain to be worse.    ROS Review of Systems  Constitutional:  Negative for chills and fever.  HENT:  Negative for congestion, sinus pressure, sinus pain and sore throat.   Eyes:  Negative for pain and discharge.  Respiratory:  Negative for cough and shortness of breath.   Cardiovascular:  Positive for palpitations. Negative for leg swelling.  Gastrointestinal:  Positive for abdominal pain. Negative for diarrhea, nausea and vomiting.  Endocrine: Negative for polydipsia and polyuria.  Genitourinary:  Negative for dysuria and hematuria.  Musculoskeletal:  Negative for neck pain and neck stiffness.  Skin:  Negative for rash.       Cyst of scalp  Neurological:  Negative  for dizziness and weakness.  Psychiatric/Behavioral:  Negative for agitation and behavioral problems.       Objective:    Physical Exam Vitals reviewed.  Constitutional:      General: She is not in acute distress.    Appearance: She is not diaphoretic.  HENT:     Head: Normocephalic and atraumatic.     Nose: Nose normal.     Mouth/Throat:     Mouth: Mucous membranes are moist.  Eyes:     General: No scleral icterus.    Extraocular Movements: Extraocular movements intact.  Cardiovascular:     Rate and Rhythm: Normal rate and regular rhythm.  Heart sounds: Normal heart sounds. No murmur heard. Pulmonary:     Breath sounds: Normal breath sounds. No wheezing or rales.  Abdominal:     Palpations: Abdomen is soft.     Tenderness: There is no abdominal tenderness.  Musculoskeletal:     Cervical back: Neck supple.     Thoracic back: Tenderness present. Decreased range of motion (Due to pain).     Right lower leg: No edema.     Left lower leg: No edema.  Skin:    General: Skin is warm.     Findings: No rash.     Comments: About 2 cm in diameter bump over scalp area  Neurological:     General: No focal deficit present.     Mental Status: She is alert and oriented to person, place, and time.     Cranial Nerves: No cranial nerve deficit.     Sensory: Sensory deficit (Right index fingertip) present.     Motor: No weakness.  Psychiatric:        Mood and Affect: Mood normal.        Behavior: Behavior normal.     BP 116/70   Pulse 61   Ht 5' 5.5 (1.664 m)   Wt 123 lb (55.8 kg)   SpO2 93%   BMI 20.16 kg/m  Wt Readings from Last 3 Encounters:  05/20/24 123 lb (55.8 kg)  01/08/24 125 lb 9.6 oz (57 kg)  09/25/23 121 lb 12.8 oz (55.2 kg)    Lab Results  Component Value Date   TSH 4.540 (H) 01/10/2024   Lab Results  Component Value Date   WBC 4.7 01/10/2024   HGB 14.2 01/10/2024   HCT 43.7 01/10/2024   MCV 92 01/10/2024   PLT 204 01/10/2024   Lab Results   Component Value Date   NA 146 (H) 01/10/2024   K 4.4 01/10/2024   CO2 20 01/10/2024   GLUCOSE 90 01/10/2024   BUN 16 01/10/2024   CREATININE 1.15 (H) 01/10/2024   BILITOT 0.4 01/10/2024   ALKPHOS 91 01/10/2024   AST 28 01/10/2024   ALT 22 01/10/2024   PROT 6.7 01/10/2024   ALBUMIN 4.7 01/10/2024   CALCIUM 10.0 01/10/2024   EGFR 51 (L) 01/10/2024   Lab Results  Component Value Date   CHOL 184 01/10/2024   Lab Results  Component Value Date   HDL 85 01/10/2024   Lab Results  Component Value Date   LDLCALC 81 01/10/2024   Lab Results  Component Value Date   TRIG 100 01/10/2024   Lab Results  Component Value Date   CHOLHDL 2.2 01/10/2024   Lab Results  Component Value Date   HGBA1C 5.5 01/10/2024      Assessment & Plan:   Problem List Items Addressed This Visit       Endocrine   Acquired hypothyroidism   Lab Results  Component Value Date   TSH 4.540 (H) 01/10/2024   On Levothyroxine  50 mcg QD Labile thyroid  function and right-sided throat soreness with odynophagia, checked US  thyroid  - benign, no suspicious nodules Has been evaluated by Dr. Lenis      Relevant Orders   TSH + free T4     Musculoskeletal and Integument   Osteoporosis without current pathological fracture   On Alendronate  (since 08/22) Continue calcium and Vitamin D  supplements        Genitourinary   CKD (chronic kidney disease) stage 3, GFR 30-59 ml/min (HCC)   Last 2 CMPs showed  GFR of 58 and 51 Advised to maintain adequate hydration Avoid nephrotoxic agents      Relevant Orders   Basic Metabolic Panel (BMET)     Other   GAD (generalized anxiety disorder) (Chronic)   Better controlled with Xanax  dose to 1 mg QPM and 0.5 mg QAM Anxiety can also contribute to muscular pain Refilled after reviewing PDMP today      Intercostal neuralgia - Primary (Chronic)   Intermittent, appears to radiating from LLQ to anterior axillary border Does not seem to be cardiac related  pain Unclear etiology, anxiety/stress worsens it - ?Fibromyalgia Previous imaging studies were insignificant including CT chest Has tried Cymbalta, Elavil , Gabapentin  and Lyrica  in the past Had referred to pain clinic - has had intercoastal nerve block and PT without much relief Referred to PM&R clinic      Relevant Orders   Ambulatory referral to Physical Medicine Rehab   Hyperlipidemia   On pravastatin  Checked lipid profile      Encounter for general adult medical examination with abnormal findings   Physical exam as documented. Counseling done  re healthy lifestyle involving commitment to 150 minutes exercise per week, heart healthy diet, and attaining healthy weight.The importance of adequate sleep also discussed. Immunization and cancer screening needs are specifically addressed at this visit.        No orders of the defined types were placed in this encounter.   Follow-up: Return in about 4 months (around 09/20/2024) for GAD and intercostal pain.    Suzzane MARLA Blanch, MD

## 2024-05-21 ENCOUNTER — Ambulatory Visit: Payer: Self-pay | Admitting: Internal Medicine

## 2024-05-21 LAB — BASIC METABOLIC PANEL WITH GFR
BUN/Creatinine Ratio: 13 (ref 12–28)
BUN: 13 mg/dL (ref 8–27)
CO2: 23 mmol/L (ref 20–29)
Calcium: 9.4 mg/dL (ref 8.7–10.3)
Chloride: 97 mmol/L (ref 96–106)
Creatinine, Ser: 1.03 mg/dL — ABNORMAL HIGH (ref 0.57–1.00)
Glucose: 80 mg/dL (ref 70–99)
Potassium: 4.2 mmol/L (ref 3.5–5.2)
Sodium: 135 mmol/L (ref 134–144)
eGFR: 57 mL/min/1.73 — ABNORMAL LOW (ref >59)

## 2024-05-21 LAB — TSH+FREE T4
Free T4: 1.34 ng/dL (ref 0.82–1.77)
TSH: 4.63 u[IU]/mL — ABNORMAL HIGH (ref 0.450–4.500)

## 2024-05-22 ENCOUNTER — Encounter: Payer: Self-pay | Admitting: Physical Medicine & Rehabilitation

## 2024-07-05 ENCOUNTER — Encounter: Attending: Physical Medicine & Rehabilitation | Admitting: Physical Medicine & Rehabilitation

## 2024-07-05 ENCOUNTER — Encounter: Payer: Self-pay | Admitting: Physical Medicine & Rehabilitation

## 2024-07-05 VITALS — BP 134/73 | HR 58 | Ht 65.5 in | Wt 123.0 lb

## 2024-07-05 DIAGNOSIS — M7918 Myalgia, other site: Secondary | ICD-10-CM | POA: Insufficient documentation

## 2024-07-05 NOTE — Patient Instructions (Addendum)
 Acupuncture Acupuncture is a type of treatment that involves stimulating specific points on your body by inserting thin needles through your skin. Acupuncture is often used to treat pain, but it may also be used to help relieve other types of symptoms. Your health care provider may recommend acupuncture to help treat various conditions, such as: Migraine and tension headaches. Nausea and vomiting after a surgery or cancer treatment. Sudden or severe (acute) pain, or long-term (chronic) pain. Addiction. Acupuncture is based on traditional [place] medicine, which recognizes more than 2,000 points on the body that connect energy pathways (meridians) through the body. The goal in stimulating these points is to balance the physical, emotional, and mental energy in your body. Acupuncture is done by a health care provider who has specialized training (licensed acupuncture practitioner). Treatment often requires several acupuncture sessions. You may have acupuncture along with other medical treatments. Tell a health care provider about: Any allergies you have. All medicines you are taking, including vitamins, herbs, eye drops, creams, and over-the-counter medicines. Any blood disorders you have. Any surgeries you have had. Any medical conditions you have. Whether you are pregnant or may be pregnant. What are the risks? Generally, this is a safe treatment. However, problems may occur, including: Skin infection. Damage to organs or structures that are under the skin if a needle is placed too deeply. This is rare. What happens before the treatment? Your acupuncture practitioner will ask about your medical history and your symptoms. You may have a physical exam. What happens during the treatment? The exact procedure will depend on your condition and how your acupuncture provider treats it. In general: Your skin will be cleaned with a germ-killing (antiseptic) solution. Your acupuncture practitioner will  open a new set of germ-free (sterile) needles. The needles will be gently inserted into your skin. They will be left in place for a certain amount of time. You may feel a tingling or burning sensation for a very short period of time. Your acupuncture practitioner may: Apply electrical energy to the needles. Adjust the needles in certain ways. After your procedure, the acupuncture practitioner will remove the needles, throw them away, and clean your skin. The procedure may vary among health care providers. What can I expect after the treatment? People react differently to acupuncture. Make sure you ask your acupuncture provider what to expect after your treatment. It is common to have: Minor bruising. Mild pain. A small amount of bleeding. Follow these instructions at home: Follow any instructions given by your provider after the treatment. Keep all follow-up visits. This is important. Where to find more information [facility] for Complementary and Integrative [facility]: [email or web address] Contact a health care provider if: You have questions about your reaction to the treatment. You have soreness. You have skin irritation or redness. You have a fever. Summary Acupuncture is a type of treatment that involves stimulating specific points on your body by inserting thin needles through your skin. This treatment is often used to treat pain, but it may also be used to help relieve other types of symptoms. The exact procedure will depend on your condition and how your acupuncture provider treats it. This information is not intended to replace advice given to you by your health care provider. Make sure you discuss any questions you have with your health care provider. Document Revised: 06/23/2021 Document Reviewed: 06/23/2021 Elsevier Patient Education  2024 [employer] Inc[employer]   I think your muscle pain is due to the Left Quadratus lumborum  Need to do  sideway stretch leaning to the right side  for 1-2 min twice a day

## 2024-07-05 NOTE — Progress Notes (Signed)
 Subjective:    Patient ID: Ruth Jennings, female    DOB: 1951/02/23, 73 y.o.   MRN: [id number]  HPI  CC:  Left flank pain   2year hx of Left sided flank, pain started at night with a palpitation  sensation, almost like a heartbeat.  The onset followed a day painting a room in her house including the ceiling.  Pt with primarily nocturnal pain but has some pain during the day No hx of shingles Some constipation which can aggravate pain if straining  Also has some LUQ discomfort that is intermittent but not daily   Colonscopy 08/08/2022 - diverticulosis but no other abnormalities   Occ bladder leakage No bowel incont  The patient went to [facility] where she received several procedures intercostal nerve block , T11-L1 MBB , and TPI not helpful  , Trigger point injection was in the back  The patient also trialed Cymbalta, gabapentin  and Lyrica .  He tried some physical therapy as well It is.  MRI THORACIC SPINE WITHOUT CONTRAST   TECHNIQUE: Multiplanar, multisequence MR imaging of the thoracic spine was performed. No intravenous contrast was administered.   COMPARISON:  None Available.   FINDINGS: Alignment:  No significant listhesis.   Vertebrae: Vertebral body heights are maintained. Scattered vertebral body hemangiomas for example at T9 and L1. No substantial marrow edema. No suspicious osseous lesion.   Cord:  Normal caliber and signal.   Paraspinal and other soft tissues: Unremarkable.   Disc levels:   Minor degenerative disc disease. For example trace disc bulge at T12-L1. Mild facet arthropathy. No significant canal or foraminal narrowing.   IMPRESSION: No compression fracture. Minor degenerative changes without significant stenosis.     Electronically Signed   By: Santina Blanch M.D.   On: 05/20/2022 14:52   Pain Inventory Average Pain 8 Pain Right Now 9 My pain is constant, burning, dull, tingling, and aching  In the last 24 hours,  has pain interfered with the following? General activity 0 Relation with others 0 Enjoyment of life 0 What TIME of day is your pain at its worst? morning , daytime, evening, night, and varies Sleep (in general) Fair  Pain is worse with: sitting and standing Pain improves with: nothing Relief from Meds: 0  how many minutes can you walk? No problem with walking ability to climb steps?  yes do you drive?  yes Do you have any goals in this area?  yes  retired  anxiety  Any changes since last visit?  no  Any changes since last visit?  no    Family History  Problem Relation Age of Onset   Colon cancer Mother    Cancer Mother    COPD Father    Social History   Socioeconomic History   Marital status: Married    Spouse name: Not on file   Number of children: 1   Years of education: HS   Highest education level: Never attended school  Occupational History   Occupation: Pharmacologist: OTHER    Comment: OWN/EMPLOYED AT [employer]  Tobacco Use   Smoking status: Never    Passive exposure: Never   Smokeless tobacco: Never  Vaping Use   Vaping status: Never Used  Substance and Sexual Activity   Alcohol use: No   Drug use: No   Sexual activity: Not Currently    Birth control/protection: Post-menopausal  Other Topics Concern   Not on file  Social History Narrative  Not on file   Social Drivers of Health   Financial Resource Strain: Low Risk  (05/16/2024)   Overall Financial Resource Strain (CARDIA)    Difficulty of Paying Living Expenses: Not very hard  Food Insecurity: No Food Insecurity (05/16/2024)   Hunger Vital Sign    Worried About Running Out of Food in the Last Year: Never true    Ran Out of Food in the Last Year: Never true  Transportation Needs: No Transportation Needs (05/16/2024)   PRAPARE - Administrator, Civil Service (Medical): No    Lack of Transportation (Non-Medical): No  Physical Activity: Unknown (05/16/2024)    Exercise Vital Sign    Days of Exercise per Week: Patient declined    Minutes of Exercise per Session: Not on file  Stress: Patient Declined (05/16/2024)   [employer] of Occupational Health - Occupational Stress Questionnaire    Feeling of Stress: Patient declined  Social Connections: Unknown (05/16/2024)   Social Connection and Isolation Panel    Frequency of Communication with Friends and Family: Once a week    Frequency of Social Gatherings with Friends and Family: Patient declined    Attends Religious Services: More than 4 times per year    Active Member of Clubs or Organizations: No    Attends Engineer, structural: Not on file    Marital Status: Married   Past Surgical History:  Procedure Laterality Date   No prior surgery     Past Medical History:  Diagnosis Date   Allergy 2021   Prednisone   Anxiety    Arthritis 2020   Cataract 2022   Hyperlipidemia    Hypothyroidism    Osteoporosis 2022   Posttraumatic stress disorder 03/01/2013   Ht 5' 5.5 (1.664 m)   Wt 123 lb (55.8 kg)   BMI 20.16 kg/m   Opioid Risk Score:   Fall Risk Score:  `1  Depression screen PHQ 2/9     07/05/2024    2:28 PM 05/20/2024    1:11 PM 01/08/2024    1:26 PM 09/13/2023   11:50 AM 09/07/2023    2:55 PM 05/09/2023    1:19 PM 12/05/2022    1:36 PM  Depression screen PHQ 2/9  Decreased Interest 0 0 0 0 0 0 0  Down, Depressed, Hopeless 0 0 0 0 0 0 0  PHQ - 2 Score 0 0 0 0 0 0 0  Altered sleeping 1 0 0 0     Tired, decreased energy 0 0 0 0     Change in appetite 0 0 0 0     Feeling bad or failure about yourself  0 0 0 0     Trouble concentrating 0 0 0 0     Moving slowly or fidgety/restless 0 0 0 0     Suicidal thoughts 0 0 0 0     PHQ-9 Score 1 0 0 0     Difficult doing work/chores  Not difficult at all Not difficult at all Not difficult at all       Review of Systems  Musculoskeletal:  Positive for back pain.  All other systems reviewed and are negative.       Objective:   Physical Exam Vitals and nursing note reviewed.  Constitutional:      Appearance: She is normal weight.  HENT:     Head: Normocephalic and atraumatic.  Eyes:     Extraocular Movements: Extraocular movements intact.  Conjunctiva/sclera: Conjunctivae normal.     Pupils: Pupils are equal, round, and reactive to light.  Musculoskeletal:     Right lower leg: No edema.     Left lower leg: No edema.  Skin:    General: Skin is warm and dry.  Neurological:     General: No focal deficit present.     Mental Status: She is alert and oriented to person, place, and time.  Psychiatric:        Mood and Affect: Mood normal.        Behavior: Behavior normal.    General no acute distress Mood and affect appropriate Motor strength is 5/5 bilateral hip flexion extension ankle dorsiflexion Abdominal exam no masses palpated generalized guarding and apprehension.  She does have an area with very superficial palpation in the left upper quadrant causing pain also radiates along her 12th rib. In addition she has some tenderness along the 12th rib as well as in the left flank area with deep palpation, edge of quadratus lumborum ROM appears tender as well There is pain with stretching over a pillow in the right lateral decubitus area over the left flank area. Negative straight leg raising bilaterally No pain with hip range of motion No pain with lumbar spine range of motion       Assessment & Plan:  1.  Chronic flank pain this is her major pain complaint.  It does appear to be a myofascial pain likely quadratus lumborum.  As discussed with patient this is a deep muscle and fairly difficult to isolate for injection.  Ultrasound can be of assistance in this matter. We also discussed alternative treatment about acupuncture with electrical stimulation which may be helpful in alleviating pain from the structure. 2.  Abdominal cutaneous nerve entrapment syndrome left upper quadrant.  Will also  treat with acupuncture.  This may respond to pain trigger point injection with corticosteroid.  Consider ultrasound guidance. Have also given the patient some stretching exercises for the left quadratus lumbar ROM.

## 2024-07-08 ENCOUNTER — Other Ambulatory Visit (HOSPITAL_COMMUNITY): Payer: Self-pay | Admitting: Internal Medicine

## 2024-07-08 DIAGNOSIS — Z1231 Encounter for screening mammogram for malignant neoplasm of breast: Secondary | ICD-10-CM

## 2024-07-18 ENCOUNTER — Ambulatory Visit (HOSPITAL_COMMUNITY)
Admission: RE | Admit: 2024-07-18 | Discharge: 2024-07-18 | Disposition: A | Discharge status: Home / Self Care | Source: Ambulatory Visit | Attending: Internal Medicine | Admitting: Internal Medicine

## 2024-07-18 ENCOUNTER — Encounter (HOSPITAL_COMMUNITY): Payer: Self-pay

## 2024-07-18 DIAGNOSIS — Z1231 Encounter for screening mammogram for malignant neoplasm of breast: Secondary | ICD-10-CM | POA: Insufficient documentation

## 2024-08-08 ENCOUNTER — Encounter: Attending: Physical Medicine & Rehabilitation | Admitting: Physical Medicine & Rehabilitation

## 2024-08-08 ENCOUNTER — Encounter: Payer: Self-pay | Admitting: Physical Medicine & Rehabilitation

## 2024-08-08 DIAGNOSIS — G588 Other specified mononeuropathies: Secondary | ICD-10-CM | POA: Diagnosis present

## 2024-08-08 DIAGNOSIS — M7918 Myalgia, other site: Secondary | ICD-10-CM | POA: Diagnosis not present

## 2024-08-08 NOTE — Progress Notes (Signed)
 Discussed the use of AI scribe software for clinical note transcription with the patient, who gave verbal consent to proceed.  History of Present Illness    Physical Exam  Assessment and Plan Assessment & Plan      Acupuncture treatment #1 Indication thoracic and  lumbar myofascial pain left side  Needles placed at left BL 20 left BL 21 left BL 22 left BL 23 as well as left BL 51 and left BL 52 Electrostimulation 2.5 Hz x 25 minutes Patient tolerated procedure well

## 2024-08-15 ENCOUNTER — Encounter: Payer: Self-pay | Admitting: Physical Medicine & Rehabilitation

## 2024-08-15 ENCOUNTER — Encounter: Admitting: Physical Medicine & Rehabilitation

## 2024-08-15 VITALS — BP 131/71 | HR 63 | Ht 65.5 in | Wt 123.0 lb

## 2024-08-15 DIAGNOSIS — M7918 Myalgia, other site: Secondary | ICD-10-CM

## 2024-08-15 DIAGNOSIS — G588 Other specified mononeuropathies: Secondary | ICD-10-CM

## 2024-08-15 NOTE — Progress Notes (Signed)
 73 year old female with lumbar myofascial pain as well as intercostal neuralgia who is here today for acupuncture treatment #2 Sterile stainless steel disposable Needles placed on the left side at BL 21 BL 22 BL 23 BL 24 and BL 25 as well as on the right side at BL 21 and BL 25.  Electrical stimulation at 150 Hz x 25 minutes patient tolerated procedure well.  Repeat next week

## 2024-08-22 ENCOUNTER — Encounter: Admitting: Physical Medicine & Rehabilitation

## 2024-08-22 ENCOUNTER — Encounter: Payer: Self-pay | Admitting: Physical Medicine & Rehabilitation

## 2024-08-22 VITALS — BP 130/78 | HR 64 | Ht 65.5 in | Wt 126.2 lb

## 2024-08-22 DIAGNOSIS — G588 Other specified mononeuropathies: Secondary | ICD-10-CM | POA: Diagnosis not present

## 2024-08-22 DIAGNOSIS — M7918 Myalgia, other site: Secondary | ICD-10-CM | POA: Diagnosis not present

## 2024-08-22 NOTE — Patient Instructions (Signed)
 Acupuncture Acupuncture is a type of treatment that involves stimulating specific points on your body by inserting thin needles through your skin. Acupuncture is often used to treat pain, but it may also be used to help relieve other types of symptoms. Your health care provider may recommend acupuncture to help treat various conditions, such as: Migraine and tension headaches. Nausea and vomiting after a surgery or cancer treatment. Sudden or severe (acute) pain, or long-term (chronic) pain. Addiction. Acupuncture is based on traditional [place] medicine, which recognizes more than 2,000 points on the body that connect energy pathways (meridians) through the body. The goal in stimulating these points is to balance the physical, emotional, and mental energy in your body. Acupuncture is done by a health care provider who has specialized training (licensed acupuncture practitioner). Treatment often requires several acupuncture sessions. You may have acupuncture along with other medical treatments. Tell a health care provider about: Any allergies you have. All medicines you are taking, including vitamins, herbs, eye drops, creams, and over-the-counter medicines. Any blood disorders you have. Any surgeries you have had. Any medical conditions you have. Whether you are pregnant or may be pregnant. What are the risks? Generally, this is a safe treatment. However, problems may occur, including: Skin infection. Damage to organs or structures that are under the skin if a needle is placed too deeply. This is rare. What happens before the treatment? Your acupuncture practitioner will ask about your medical history and your symptoms. You may have a physical exam. What happens during the treatment? The exact procedure will depend on your condition and how your acupuncture provider treats it. In general: Your skin will be cleaned with a germ-killing (antiseptic) solution. Your acupuncture practitioner will  open a new set of germ-free (sterile) needles. The needles will be gently inserted into your skin. They will be left in place for a certain amount of time. You may feel a tingling or burning sensation for a very short period of time. Your acupuncture practitioner may: Apply electrical energy to the needles. Adjust the needles in certain ways. After your procedure, the acupuncture practitioner will remove the needles, throw them away, and clean your skin. The procedure may vary among health care providers. What can I expect after the treatment? People react differently to acupuncture. Make sure you ask your acupuncture provider what to expect after your treatment. It is common to have: Minor bruising. Mild pain. A small amount of bleeding. Follow these instructions at home: Follow any instructions given by your provider after the treatment. Keep all follow-up visits. This is important. Where to find more information [facility]: [email or web address] Contact a health care provider if: You have questions about your reaction to the treatment. You have soreness. You have skin irritation or redness. You have a fever. Summary Acupuncture is a type of treatment that involves stimulating specific points on your body by inserting thin needles through your skin. This treatment is often used to treat pain, but it may also be used to help relieve other types of symptoms. The exact procedure will depend on your condition and how your acupuncture provider treats it. This information is not intended to replace advice given to you by your health care provider. Make sure you discuss any questions you have with your health care provider. Document Revised: 06/23/2021 Document Reviewed: 06/23/2021 Elsevier Patient Education  2024 [employer].

## 2024-08-22 NOTE — Progress Notes (Signed)
 73 year old female with lumbar myofascial pain as well as intercostal neuralgia who is here today for acupuncture treatment #3 Improved treatment effect from increasing frequency to 250 Hz at last visit 30mm Sterile stainless steel disposable Needles placed on the left side at BL 21 BL 22 BL 23 BL 24  as well as on the right side at BL 21 and BL 24.  In addition the patient had marked her most common painful areas which were approximately 5 cm lateral to left BL 1-1 and left BL 23 as well as at the left GB 25 and another point  2cm inferior electrical stimulation at 150 Hz x 25 minutes patient tolerated procedure well.  Repeat next week

## 2024-08-29 ENCOUNTER — Encounter: Admitting: Physical Medicine & Rehabilitation

## 2024-08-29 ENCOUNTER — Encounter: Payer: Self-pay | Admitting: Physical Medicine & Rehabilitation

## 2024-08-29 VITALS — BP 148/77 | HR 66 | Ht 65.5 in | Wt 127.4 lb

## 2024-08-29 DIAGNOSIS — G588 Other specified mononeuropathies: Secondary | ICD-10-CM | POA: Diagnosis not present

## 2024-08-29 DIAGNOSIS — M7918 Myalgia, other site: Secondary | ICD-10-CM

## 2024-08-29 NOTE — Progress Notes (Signed)
 73 year old female with lumbar myofascial pain as well as intercostal neuralgia who is here today for acupuncture treatment #4 Improved treatment effect from increasing frequency to 250 Hz at last visit 30mm Sterile stainless steel disposable Needles placed on the left side at BL 21 BL 22 BL 23 BL 24, BL 25    In addition the patient had marked her most common painful areas which were approximately 5 cm lateral to left BL 21 and left BL 23 as well as at the left GB 25 and another point  2cm inferior electrical stimulation at 150 Hz x 25 minutes patient tolerated procedure well.  Repeat in 2 weeks

## 2024-09-05 ENCOUNTER — Encounter: Admitting: Physical Medicine & Rehabilitation

## 2024-09-12 ENCOUNTER — Encounter: Payer: Self-pay | Admitting: Physical Medicine & Rehabilitation

## 2024-09-12 ENCOUNTER — Encounter: Attending: Physical Medicine & Rehabilitation | Admitting: Physical Medicine & Rehabilitation

## 2024-09-12 VITALS — BP 138/78 | HR 62 | Ht 65.5 in | Wt 125.0 lb

## 2024-09-12 DIAGNOSIS — M7918 Myalgia, other site: Secondary | ICD-10-CM | POA: Diagnosis present

## 2024-09-12 DIAGNOSIS — G588 Other specified mononeuropathies: Secondary | ICD-10-CM | POA: Diagnosis present

## 2024-09-12 NOTE — Progress Notes (Signed)
 73 year old female with lumbar myofascial pain as well as intercostal neuralgia who is here today for acupuncture treatment #5 Improved treatment effect from increasing frequency to 150 Hz at last visit 30mm Sterile stainless steel disposable Needles placed on the left side at BL 24, BL 25    In addition the patient had marked her most common painful areas which were approximately 5 cm lateral to left BL 21 and left BL 23 as well as at the left GB 25 and another point  2cm inferior electrical stimulation at 150 Hz x 25 minutes patient tolerated procedure well.  We discussed that while the acupuncture has been helpful for her neurogenic pain related to her intercostal neuralgia she still has low back myofascial pain.  Recommend shockwave therapy to the lower lumbar and quadratus lumborum area on the left side.  6-8 treatments estimated

## 2024-09-12 NOTE — Patient Instructions (Signed)
 Acupuncture Acupuncture is a type of treatment that involves stimulating specific points on your body by inserting thin needles through your skin. Acupuncture is often used to treat pain, but it may also be used to help relieve other types of symptoms. Your health care provider may recommend acupuncture to help treat various conditions, such as: Migraine and tension headaches. Nausea and vomiting after a surgery or cancer treatment. Sudden or severe (acute) pain, or long-term (chronic) pain. Addiction. Acupuncture is based on traditional [place] medicine, which recognizes more than 2,000 points on the body that connect energy pathways (meridians) through the body. The goal in stimulating these points is to balance the physical, emotional, and mental energy in your body. Acupuncture is done by a health care provider who has specialized training (licensed acupuncture practitioner). Treatment often requires several acupuncture sessions. You may have acupuncture along with other medical treatments. Tell a health care provider about: Any allergies you have. All medicines you are taking, including vitamins, herbs, eye drops, creams, and over-the-counter medicines. Any blood disorders you have. Any surgeries you have had. Any medical conditions you have. Whether you are pregnant or may be pregnant. What are the risks? Generally, this is a safe treatment. However, problems may occur, including: Skin infection. Damage to organs or structures that are under the skin if a needle is placed too deeply. This is rare. What happens before the treatment? Your acupuncture practitioner will ask about your medical history and your symptoms. You may have a physical exam. What happens during the treatment? The exact procedure will depend on your condition and how your acupuncture provider treats it. In general: Your skin will be cleaned with a germ-killing (antiseptic) solution. Your acupuncture practitioner will  open a new set of germ-free (sterile) needles. The needles will be gently inserted into your skin. They will be left in place for a certain amount of time. You may feel a tingling or burning sensation for a very short period of time. Your acupuncture practitioner may: Apply electrical energy to the needles. Adjust the needles in certain ways. After your procedure, the acupuncture practitioner will remove the needles, throw them away, and clean your skin. The procedure may vary among health care providers. What can I expect after the treatment? People react differently to acupuncture. Make sure you ask your acupuncture provider what to expect after your treatment. It is common to have: Minor bruising. Mild pain. A small amount of bleeding. Follow these instructions at home: Follow any instructions given by your provider after the treatment. Keep all follow-up visits. This is important. Where to find more information [facility]: [email or web address] Contact a health care provider if: You have questions about your reaction to the treatment. You have soreness. You have skin irritation or redness. You have a fever. Summary Acupuncture is a type of treatment that involves stimulating specific points on your body by inserting thin needles through your skin. This treatment is often used to treat pain, but it may also be used to help relieve other types of symptoms. The exact procedure will depend on your condition and how your acupuncture provider treats it. This information is not intended to replace advice given to you by your health care provider. Make sure you discuss any questions you have with your health care provider. Document Revised: 06/23/2021 Document Reviewed: 06/23/2021 Elsevier Patient Education  2024 [employer].

## 2024-09-19 ENCOUNTER — Ambulatory Visit

## 2024-09-19 VITALS — Ht 66.0 in | Wt 125.0 lb

## 2024-09-19 DIAGNOSIS — Z0000 Encounter for general adult medical examination without abnormal findings: Secondary | ICD-10-CM | POA: Diagnosis not present

## 2024-09-19 NOTE — Patient Instructions (Signed)
 Ruth Jennings,  Thank you for taking the time for your Medicare Wellness Visit. I appreciate your continued commitment to your health goals. Please review the care plan we discussed, and feel free to reach out if I can assist you further.  Please note that Annual Wellness Visits do not include a physical exam. Some assessments may be limited, especially if the visit was conducted virtually. If needed, we may recommend an in-person follow-up with your provider.  Ongoing Care Seeing your primary care provider every 3 to 6 months helps us  monitor your health and provide consistent, personalized care.   Referrals If a referral was made during today's visit and you haven't received any updates within two weeks, please contact the referred provider directly to check on the status.   Recommended Screenings:  Health Maintenance  Topic Date Due   DTaP/Tdap/Td vaccine (1 - Tdap) Never done   Flu Shot  05/31/2024   COVID-19 Vaccine (4 - 2025-26 season) 07/01/2024   Medicare Annual Wellness Visit  09/12/2024   Osteoporosis screening with Bone Density Scan  05/16/2025   Breast Cancer Screening  07/18/2025   Colon Cancer Screening  06/03/2026   Pneumococcal Vaccine for age over 25  Completed   Hepatitis C Screening  Completed   Zoster (Shingles) Vaccine  Completed   Meningitis B Vaccine  Aged Out       09/15/2024    8:52 AM  Advanced Directives  Does Patient Have a Medical Advance Directive? No  Would patient like information on creating a medical advance directive? No - Patient declined    Vision: Annual vision screenings are recommended for early detection of glaucoma, cataracts, and diabetic retinopathy. These exams can also reveal signs of chronic conditions such as diabetes and high blood pressure.  Dental: Annual dental screenings help detect early signs of oral cancer, gum disease, and other conditions linked to overall health, including heart disease and diabetes.  Please see the  attached documents for additional preventive care recommendations.

## 2024-09-19 NOTE — Progress Notes (Signed)
 Chief Complaint  Patient presents with   Medicare Wellness     Subjective:   Ruth Jennings is a 73 y.o. female who presents for a Medicare Annual Wellness Visit.  Allergies (verified) Prednisone   History: Past Medical History:  Diagnosis Date   Allergy 2021   Prednisone   Anxiety    Arthritis 2020   Cataract 2022   Hyperlipidemia    Hypothyroidism    Osteoporosis 2022   Posttraumatic stress disorder 03/01/2013   Past Surgical History:  Procedure Laterality Date   No prior surgery     Family History  Problem Relation Age of Onset   Colon cancer Mother    Cancer Mother    COPD Father    Social History   Occupational History   Occupation: Pharmacologist: OTHER    Comment: OWN/EMPLOYED AT [employer]  Tobacco Use   Smoking status: Never    Passive exposure: Never   Smokeless tobacco: Never  Vaping Use   Vaping status: Never Used  Substance and Sexual Activity   Alcohol use: No   Drug use: No   Sexual activity: Not Currently    Birth control/protection: Post-menopausal   Tobacco Counseling Counseling given: Yes  SDOH Screenings   Food Insecurity: No Food Insecurity (09/15/2024)  Housing: Low Risk  (09/15/2024)  Transportation Needs: No Transportation Needs (09/15/2024)  Utilities: Not At Risk (09/19/2024)  Alcohol Screen: Low Risk  (09/11/2023)  Depression (PHQ2-9): Low Risk  (09/19/2024)  Financial Resource Strain: Low Risk  (09/15/2024)  Physical Activity: Unknown (09/15/2024)  Social Connections: Unknown (09/15/2024)  Stress: No Stress Concern Present (09/15/2024)  Tobacco Use: Low Risk  (09/19/2024)  Health Literacy: Adequate Health Literacy (09/19/2024)   See flowsheets for full screening details  Depression Screen PHQ 2 & 9 Depression Scale- Over the past 2 weeks, how often have you been bothered by any of the following problems? Little interest or pleasure in doing things: 0 Feeling down, depressed, or hopeless (PHQ  Adolescent also includes...irritable): 0 PHQ-2 Total Score: 0 Trouble falling or staying asleep, or sleeping too much: 3 Feeling tired or having little energy: 0 Poor appetite or overeating (PHQ Adolescent also includes...weight loss): 0 Feeling bad about yourself - or that you are a failure or have let yourself or your family down: 0 Trouble concentrating on things, such as reading the newspaper or watching television (PHQ Adolescent also includes...like school work): 0 Moving or speaking so slowly that other people could have noticed. Or the opposite - being so fidgety or restless that you have been moving around a lot more than usual: 0 Thoughts that you would be better off dead, or of hurting yourself in some way: 0 PHQ-9 Total Score: 3 If you checked off any problems, how difficult have these problems made it for you to do your work, take care of things at home, or get along with other people?: Not difficult at all  Depression Treatment Depression Interventions/Treatment : EYV7-0 Score <4 Follow-up Not Indicated     Goals Addressed               This Visit's Progress     I want to get my lower back pain under control. (pt-stated)         Visit info / Clinical Intake: Medicare Wellness Visit Type:: Subsequent Annual Wellness Visit Persons participating in visit:: patient Medicare Wellness Visit Mode:: Video Because this visit was a virtual/telehealth visit:: pt reported vitals If Telephone  or Video please confirm:: I connected with the patient using audio enabled telemedicine application and verified that I am speaking with the correct person using two identifiers; I discussed the limitations of evaluation and management by telemedicine; The patient expressed understanding and agreed to proceed Patient Location:: home Provider Location:: office Information given by:: patient Interpreter Needed?: No Pre-visit prep was completed: yes AWV questionnaire completed by patient  prior to visit?: yes Date:: 09/15/24 Living arrangements:: lives with spouse/significant other Patient's Overall Health Status Rating: very good Typical amount of pain: (!) a lot Does pain affect daily life?: no Are you currently prescribed opioids?: no  Dietary Habits and Nutritional Risks How many meals a day?: 2 Eats fruit and vegetables daily?: (!) no Most meals are obtained by: preparing own meals In the last 2 weeks, have you had any of the following?: none Diabetic:: no  Functional Status Activities of Daily Living (to include ambulation/medication): (Patient-Rptd) Independent Ambulation: (Patient-Rptd) Independent Medication Administration: Independent Home Management: (Patient-Rptd) Independent Manage your own finances?: yes Primary transportation is: family/friends Concerns about vision?: no *vision screening is required for WTM* Concerns about hearing?: no  Fall Screening Falls in the past year?: (Patient-Rptd) 0 Number of falls in past year: (Patient-Rptd) 0 Was there an injury with Fall?: (Patient-Rptd) 0 Fall Risk Category Calculator: (Patient-Rptd) 0 Patient Fall Risk Level: (Patient-Rptd) Low Fall Risk  Fall Risk Patient at Risk for Falls Due to: No Fall Risks Fall risk Follow up: Falls evaluation completed; Education provided; Falls prevention discussed  Home and Transportation Safety: All rugs have non-skid backing?: yes All stairs or steps have railings?: yes Grab bars in the bathtub or shower?: yes Have non-skid surface in bathtub or shower?: yes Good home lighting?: yes Regular seat belt use?: yes Hospital stays in the last year:: no  Cognitive Assessment Difficulty concentrating, remembering, or making decisions? : no Will 6CIT or Mini Cog be Completed: no 6CIT or Mini Cog Declined: patient alert, oriented, able to answer questions appropriately and recall recent events  Advance Directives (For Healthcare) Does Patient Have a Medical Advance  Directive?: No Would patient like information on creating a medical advance directive?: No - Patient declined  Reviewed/Updated  Reviewed/Updated: Reviewed All (Medical, Surgical, Family, Medications, Allergies, Care Teams, Patient Goals)        Objective:    Today's Vitals   09/19/24 1037  Weight: 125 lb (56.7 kg)  Height: 5' 6 (1.676 m)   Body mass index is 20.18 kg/m.  Current Medications (verified) Outpatient Encounter Medications as of 09/19/2024  Medication Sig   alendronate  (FOSAMAX ) 70 MG tablet TAKE 1 TABLET ONCE A WEEK IN THE MORNING 30 MIN BEFORE EATING WITH AN 8OZ GLASS OF WATER (SIT UP 30 MIN)   ALPRAZolam  (XANAX ) 1 MG tablet Take 1 tablet (1 mg total) by mouth at bedtime AND 0.5 tablets (0.5 mg total) in the morning.   Cholecalciferol 25 MCG (1000 UT) tablet Take 2 tablets by mouth daily.   cyanocobalamin 1000 MCG tablet Take by mouth.   levothyroxine  (SYNTHROID ) 50 MCG tablet TAKE ONE TABLET BY MOUTH EVERY DAY   pravastatin  (PRAVACHOL ) 40 MG tablet Take 1 tablet (40 mg total) by mouth daily.   No facility-administered encounter medications on file as of 09/19/2024.   Hearing/Vision screen Hearing Screening - Comments:: Patient denies any hearing difficulties.   Vision Screening - Comments:: Wears rx glasses - up to date with routine eye exams with  Oneil Kawasaki @ [facility] [place] location Immunizations and  Health Maintenance Health Maintenance  Topic Date Due   DTaP/Tdap/Td (1 - Tdap) Never done   Influenza Vaccine  05/31/2024   COVID-19 Vaccine (4 - 2025-26 season) 07/01/2024   Medicare Annual Wellness (AWV)  09/12/2024   Bone Density Scan  05/16/2025   Mammogram  07/18/2025   Colonoscopy  06/03/2026   Pneumococcal Vaccine: 50+ Years  Completed   Hepatitis C Screening  Completed   Zoster Vaccines- Shingrix  Completed   Meningococcal B Vaccine  Aged Out        Assessment/Plan:  This is a routine wellness examination for Ruth Jennings.  Patient  Care Team: Tobie Suzzane POUR, MD as PCP - General (Internal Medicine) Debera Jayson MATSU, MD as PCP - Cardiology (Cardiology)  I have personally reviewed and noted the following in the patient's chart:   Medical and social history Use of alcohol, tobacco or illicit drugs  Current medications and supplements including opioid prescriptions. Functional ability and status Nutritional status Physical activity Advanced directives List of other physicians Hospitalizations, surgeries, and ER visits in previous 12 months Vitals Screenings to include cognitive, depression, and falls Referrals and appointments  No orders of the defined types were placed in this encounter.  In addition, I have reviewed and discussed with patient certain preventive protocols, quality metrics, and best practice recommendations. A written personalized care plan for preventive services as well as general preventive health recommendations were provided to patient.   Alam Guterrez, CMA   09/19/2024   No follow-ups on file.  After Visit Summary: (MyChart) Due to this being a telephonic visit, the after visit summary with patients personalized plan was offered to patient via MyChart   Nurse Notes: n/a

## 2024-09-20 ENCOUNTER — Encounter: Admitting: Physical Medicine & Rehabilitation

## 2024-09-20 ENCOUNTER — Other Ambulatory Visit: Payer: Self-pay | Admitting: Internal Medicine

## 2024-09-20 DIAGNOSIS — M810 Age-related osteoporosis without current pathological fracture: Secondary | ICD-10-CM

## 2024-09-20 NOTE — Progress Notes (Signed)
 Chief Complaint  Patient presents with   Medicare Wellness     Subjective:   Ruth Jennings is a 73 y.o. female who presents for a Medicare Annual Wellness Visit.  Allergies (verified) Prednisone   History: Past Medical History:  Diagnosis Date   Allergy 2021   Prednisone   Anxiety    Arthritis 2020   Cataract 2022   Hyperlipidemia    Hypothyroidism    Osteoporosis 2022   Posttraumatic stress disorder 03/01/2013   Past Surgical History:  Procedure Laterality Date   No prior surgery     Family History  Problem Relation Age of Onset   Colon cancer Mother    Cancer Mother    COPD Father    Social History   Occupational History   Occupation: Pharmacologist: OTHER    Comment: OWN/EMPLOYED AT [employer]  Tobacco Use   Smoking status: Never    Passive exposure: Never   Smokeless tobacco: Never  Vaping Use   Vaping status: Never Used  Substance and Sexual Activity   Alcohol use: No   Drug use: No   Sexual activity: Not Currently    Birth control/protection: Post-menopausal   Tobacco Counseling Counseling given: Yes  SDOH Screenings   Food Insecurity: No Food Insecurity (09/15/2024)  Housing: Low Risk  (09/15/2024)  Transportation Needs: No Transportation Needs (09/15/2024)  Utilities: Not At Risk (09/19/2024)  Alcohol Screen: Low Risk  (09/11/2023)  Depression (PHQ2-9): Low Risk  (09/19/2024)  Financial Resource Strain: Low Risk  (09/15/2024)  Physical Activity: Unknown (09/15/2024)  Social Connections: Unknown (09/15/2024)  Stress: No Stress Concern Present (09/15/2024)  Tobacco Use: Low Risk  (09/19/2024)  Health Literacy: Adequate Health Literacy (09/19/2024)   See flowsheets for full screening details  Depression Screen PHQ 2 & 9 Depression Scale- Over the past 2 weeks, how often have you been bothered by any of the following problems? Little interest or pleasure in doing things: 0 Feeling down, depressed, or hopeless (PHQ  Adolescent also includes...irritable): 0 PHQ-2 Total Score: 0 Trouble falling or staying asleep, or sleeping too much: 3 Feeling tired or having little energy: 0 Poor appetite or overeating (PHQ Adolescent also includes...weight loss): 0 Feeling bad about yourself - or that you are a failure or have let yourself or your family down: 0 Trouble concentrating on things, such as reading the newspaper or watching television (PHQ Adolescent also includes...like school work): 0 Moving or speaking so slowly that other people could have noticed. Or the opposite - being so fidgety or restless that you have been moving around a lot more than usual: 0 Thoughts that you would be better off dead, or of hurting yourself in some way: 0 PHQ-9 Total Score: 3 If you checked off any problems, how difficult have these problems made it for you to do your work, take care of things at home, or get along with other people?: Not difficult at all  Depression Treatment Depression Interventions/Treatment : EYV7-0 Score <4 Follow-up Not Indicated     Goals Addressed               This Visit's Progress     I want to get my lower back pain under control. (pt-stated)         Visit info / Clinical Intake: Medicare Wellness Visit Type:: Subsequent Annual Wellness Visit Persons participating in visit:: patient Medicare Wellness Visit Mode:: Video Because this visit was a virtual/telehealth visit:: pt reported vitals If Telephone  or Video please confirm:: I connected with the patient using audio enabled telemedicine application and verified that I am speaking with the correct person using two identifiers; I discussed the limitations of evaluation and management by telemedicine; The patient expressed understanding and agreed to proceed Patient Location:: home Provider Location:: office Information given by:: patient Interpreter Needed?: No Pre-visit prep was completed: yes AWV questionnaire completed by patient  prior to visit?: yes Date:: 09/15/24 Living arrangements:: lives with spouse/significant other Patient's Overall Health Status Rating: very good Typical amount of pain: (!) a lot Does pain affect daily life?: no Are you currently prescribed opioids?: no  Dietary Habits and Nutritional Risks How many meals a day?: 2 Eats fruit and vegetables daily?: (!) no Most meals are obtained by: preparing own meals In the last 2 weeks, have you had any of the following?: none Diabetic:: no  Functional Status Activities of Daily Living (to include ambulation/medication): (Patient-Rptd) Independent Ambulation: (Patient-Rptd) Independent Medication Administration: Independent Home Management: (Patient-Rptd) Independent Manage your own finances?: yes Primary transportation is: family/friends Concerns about vision?: no *vision screening is required for WTM* Concerns about hearing?: no  Fall Screening Falls in the past year?: (Patient-Rptd) 0 Number of falls in past year: (Patient-Rptd) 0 Was there an injury with Fall?: (Patient-Rptd) 0 Fall Risk Category Calculator: (Patient-Rptd) 0 Patient Fall Risk Level: (Patient-Rptd) Low Fall Risk  Fall Risk Patient at Risk for Falls Due to: No Fall Risks Fall risk Follow up: Falls evaluation completed; Education provided; Falls prevention discussed  Home and Transportation Safety: All rugs have non-skid backing?: yes All stairs or steps have railings?: yes Grab bars in the bathtub or shower?: yes Have non-skid surface in bathtub or shower?: yes Good home lighting?: yes Regular seat belt use?: yes Hospital stays in the last year:: no  Cognitive Assessment Difficulty concentrating, remembering, or making decisions? : no Will 6CIT or Mini Cog be Completed: no 6CIT or Mini Cog Declined: patient alert, oriented, able to answer questions appropriately and recall recent events  Advance Directives (For Healthcare) Does Patient Have a Medical Advance  Directive?: No Would patient like information on creating a medical advance directive?: No - Patient declined  Reviewed/Updated  Reviewed/Updated: Reviewed All (Medical, Surgical, Family, Medications, Allergies, Care Teams, Patient Goals)        Objective:    Today's Vitals   09/19/24 1037  Weight: 125 lb (56.7 kg)  Height: 5' 6 (1.676 m)   Body mass index is 20.18 kg/m.  Current Medications (verified) Outpatient Encounter Medications as of 09/19/2024  Medication Sig   ALPRAZolam  (XANAX ) 1 MG tablet Take 1 tablet (1 mg total) by mouth at bedtime AND 0.5 tablets (0.5 mg total) in the morning.   Cholecalciferol 25 MCG (1000 UT) tablet Take 2 tablets by mouth daily.   cyanocobalamin 1000 MCG tablet Take by mouth.   levothyroxine  (SYNTHROID ) 50 MCG tablet TAKE ONE TABLET BY MOUTH EVERY DAY   pravastatin  (PRAVACHOL ) 40 MG tablet Take 1 tablet (40 mg total) by mouth daily.   [DISCONTINUED] alendronate  (FOSAMAX ) 70 MG tablet TAKE 1 TABLET ONCE A WEEK IN THE MORNING 30 MIN BEFORE EATING WITH AN 8OZ GLASS OF WATER (SIT UP 30 MIN)   No facility-administered encounter medications on file as of 09/19/2024.   Hearing/Vision screen Hearing Screening - Comments:: Patient denies any hearing difficulties.   Vision Screening - Comments:: Wears rx glasses - up to date with routine eye exams with  Oneil Kawasaki @ [facility] [place] location Immunizations  and Health Maintenance Health Maintenance  Topic Date Due   DTaP/Tdap/Td (1 - Tdap) Never done   Influenza Vaccine  05/31/2024   COVID-19 Vaccine (4 - 2025-26 season) 07/01/2024   Bone Density Scan  05/16/2025   Mammogram  07/18/2025   Medicare Annual Wellness (AWV)  09/19/2025   Colonoscopy  06/03/2026   Pneumococcal Vaccine: 50+ Years  Completed   Hepatitis C Screening  Completed   Zoster Vaccines- Shingrix  Completed   Meningococcal B Vaccine  Aged Out        Assessment/Plan:  This is a routine wellness examination for  [place].  Patient Care Team: Tobie Suzzane POUR, MD as PCP - General (Internal Medicine) Debera Jayson MATSU, MD as PCP - Cardiology (Cardiology) Darroll Anes, DO (Optometry) Kirsteins, Prentice BRAVO, MD as Consulting Physician (Physical Medicine and Rehabilitation) Harrie Agent, MD as Consulting Physician (Ophthalmology) Lenis Ethelle ORN, MD as Consulting Physician (Endocrinology)  I have personally reviewed and noted the following in the patient's chart:   Medical and social history Use of alcohol, tobacco or illicit drugs  Current medications and supplements including opioid prescriptions. Functional ability and status Nutritional status Physical activity Advanced directives List of other physicians Hospitalizations, surgeries, and ER visits in previous 12 months Vitals Screenings to include cognitive, depression, and falls Referrals and appointments  No orders of the defined types were placed in this encounter.  In addition, I have reviewed and discussed with patient certain preventive protocols, quality metrics, and best practice recommendations. A written personalized care plan for preventive services as well as general preventive health recommendations were provided to patient.   Adriene Knipfer, CMA   09/20/2024   Return on Monday September 22, 2025 at 2:30 pm, for your yearly Medicare Wellness Visit in person.  After Visit Summary: (MyChart) Due to this being a telephonic visit, the after visit summary with patients personalized plan was offered to patient via MyChart   Nurse Notes: n/a

## 2024-09-24 ENCOUNTER — Ambulatory Visit (INDEPENDENT_AMBULATORY_CARE_PROVIDER_SITE_OTHER): Admitting: Internal Medicine

## 2024-09-24 ENCOUNTER — Encounter: Payer: Self-pay | Admitting: Internal Medicine

## 2024-09-24 VITALS — BP 121/73 | HR 69 | Ht 65.5 in | Wt 126.6 lb

## 2024-09-24 DIAGNOSIS — E782 Mixed hyperlipidemia: Secondary | ICD-10-CM | POA: Diagnosis not present

## 2024-09-24 DIAGNOSIS — G588 Other specified mononeuropathies: Secondary | ICD-10-CM | POA: Diagnosis not present

## 2024-09-24 DIAGNOSIS — F411 Generalized anxiety disorder: Secondary | ICD-10-CM

## 2024-09-24 DIAGNOSIS — N1831 Chronic kidney disease, stage 3a: Secondary | ICD-10-CM | POA: Diagnosis not present

## 2024-09-24 DIAGNOSIS — Z0001 Encounter for general adult medical examination with abnormal findings: Secondary | ICD-10-CM

## 2024-09-24 DIAGNOSIS — Z23 Encounter for immunization: Secondary | ICD-10-CM

## 2024-09-24 DIAGNOSIS — E039 Hypothyroidism, unspecified: Secondary | ICD-10-CM

## 2024-09-24 DIAGNOSIS — G4700 Insomnia, unspecified: Secondary | ICD-10-CM | POA: Diagnosis not present

## 2024-09-24 MED ORDER — TRAZODONE HCL 50 MG PO TABS: 25.0000 mg | ORAL_TABLET | Freq: Every evening | ORAL | 3 refills | Status: AC | PRN

## 2024-09-24 MED ORDER — ALPRAZOLAM 0.5 MG PO TABS: 0.5000 mg | ORAL_TABLET | Freq: Every day | ORAL | 0 refills | Status: AC

## 2024-09-24 NOTE — Patient Instructions (Signed)
 Please start taking Trazodone  as needed for insomnia.  Please continue taking Xanax  0.5 mg once at bedtime.  Please continue to take medications as prescribed.  Please continue to follow low salt diet and ambulate as tolerated.

## 2024-09-24 NOTE — Assessment & Plan Note (Signed)
 Lab Results  Component Value Date   TSH 4.630 (H) 05/20/2024   On Levothyroxine  50 mcg QD Labile thyroid  function and right-sided throat soreness with odynophagia, checked US  thyroid  - benign, no suspicious nodules Has been evaluated by Dr. Lenis

## 2024-09-24 NOTE — Assessment & Plan Note (Signed)
On pravastatin Checked lipid profile

## 2024-09-24 NOTE — Progress Notes (Unsigned)
 Established Patient Office Visit  Subjective:  Patient ID: Ruth Jennings, female    DOB: 26-May-1951  Age: 73 y.o. MRN: [id number]  CC:  Chief Complaint  Patient presents with   Follow-up    Follow up    HPI Ruth Jennings is a 73 y.o. female with past medical history of hypothyroidism, CKD stage 3a, anxiety and HLD who presents for f/u of her chronic medical conditions.  Left sided chest wall pain: Chronic, intermittent, worse at night radiating from LLQ abdomen and thoracic spine area. Associated with palpitations. Has tried Cymbalta, Elavil , Gabapentin  and Lyrica . Has had CT chest and Cardiology evaluation. She was referred to pain clinic in [place], and has had intercostal nerve blocks without any relief.  She has tried physical therapy with minimal relief as well.  She has difficulty sleeping due to severe pain.  Hypothyroidism: Takes Levothyroxine  50 mcg QD. She has chronic hair loss concern.  Denies any constipation, diarrhea, recent change in appetite or weight.  US  thyroid  showed small diffusely heterogeneous and atrophic thyroid  gland.  She has been evaluated by endocrinology as well.  Anxiety: Better controlled, has had to take Xanax  in the morning on some days for severe anxiety. She takes Xanax  1 mg at bedtime for insomnia. Sleep is affected by chest wall pain. Denies any anhedonia, SI or HI.  She reports numbness in the right hand index fingertip, which is constant.  Denies any burning pain.  Denies any wrist pain currently.  Denies any recent injury.   Past Medical History:  Diagnosis Date   Allergy 2021   Prednisone   Anxiety    Arthritis 2020   Cataract 2022   Hyperlipidemia    Hypothyroidism    Osteoporosis 2022   Posttraumatic stress disorder 03/01/2013    Past Surgical History:  Procedure Laterality Date   No prior surgery      Family History  Problem Relation Age of Onset   Colon cancer Mother    Cancer Mother    COPD Father     Social History    Socioeconomic History   Marital status: Married    Spouse name: Not on file   Number of children: 1   Years of education: HS   Highest education level: 12th grade  Occupational History   Occupation: Pharmacologist: OTHER    Comment: OWN/EMPLOYED AT [employer]  Tobacco Use   Smoking status: Never    Passive exposure: Never   Smokeless tobacco: Never  Vaping Use   Vaping status: Never Used  Substance and Sexual Activity   Alcohol use: No   Drug use: No   Sexual activity: Not Currently    Birth control/protection: Post-menopausal  Other Topics Concern   Not on file  Social History Narrative   Not on file   Social Drivers of Health   Financial Resource Strain: Low Risk  (09/15/2024)   Overall Financial Resource Strain (CARDIA)    Difficulty of Paying Living Expenses: Not hard at all  Food Insecurity: No Food Insecurity (09/15/2024)   Hunger Vital Sign    Worried About Running Out of Food in the Last Year: Never true    Ran Out of Food in the Last Year: Never true  Transportation Needs: No Transportation Needs (09/15/2024)   PRAPARE - Administrator, Civil Service (Medical): No    Lack of Transportation (Non-Medical): No  Physical Activity: Unknown (09/15/2024)   Exercise Vital Sign  Days of Exercise per Week: 2 days    Minutes of Exercise per Session: Patient declined  Stress: No Stress Concern Present (09/15/2024)   [employer] of Occupational Health - Occupational Stress Questionnaire    Feeling of Stress: Only a little  Social Connections: Unknown (09/15/2024)   Social Connection and Isolation Panel    Frequency of Communication with Friends and Family: Twice a week    Frequency of Social Gatherings with Friends and Family: Once a week    Attends Religious Services: 1 to 4 times per year    Active Member of [employer] or Organizations: Patient declined    Attends Banker Meetings: Patient declined    Marital  Status: Married  Catering Manager Violence: Not At Risk (09/19/2024)   Humiliation, Afraid, Rape, and Kick questionnaire    Fear of Current or Ex-Partner: No    Emotionally Abused: No    Physically Abused: No    Sexually Abused: No    Outpatient Medications Prior to Visit  Medication Sig Dispense Refill   alendronate  (FOSAMAX ) 70 MG tablet TAKE 1 TABLET ONCE A WEEK IN THE MORNING 30 MIN BEFORE EATING WITH AN 8OZ GLASS OF WATER (SIT UP 30 MIN) 12 tablet 3   ALPRAZolam  (XANAX ) 1 MG tablet Take 1 tablet (1 mg total) by mouth at bedtime AND 0.5 tablets (0.5 mg total) in the morning. 135 tablet 1   Cholecalciferol 25 MCG (1000 UT) tablet Take 2 tablets by mouth daily.     cyanocobalamin 1000 MCG tablet Take by mouth.     levothyroxine  (SYNTHROID ) 50 MCG tablet TAKE ONE TABLET BY MOUTH EVERY DAY 90 tablet 1   pravastatin  (PRAVACHOL ) 40 MG tablet Take 1 tablet (40 mg total) by mouth daily. 90 tablet 3   No facility-administered medications prior to visit.    Allergies  Allergen Reactions   Prednisone Other (See Comments)    Caused pain to be worse.    ROS Review of Systems  Constitutional:  Negative for chills and fever.  HENT:  Negative for congestion, sinus pressure, sinus pain and sore throat.   Eyes:  Negative for pain and discharge.  Respiratory:  Negative for cough and shortness of breath.   Cardiovascular:  Positive for palpitations. Negative for leg swelling.  Gastrointestinal:  Positive for abdominal pain. Negative for diarrhea, nausea and vomiting.  Endocrine: Negative for polydipsia and polyuria.  Genitourinary:  Negative for dysuria and hematuria.  Musculoskeletal:  Negative for neck pain and neck stiffness.  Skin:  Negative for rash.       Cyst of scalp  Neurological:  Negative for dizziness and weakness.  Psychiatric/Behavioral:  Negative for agitation and behavioral problems.       Objective:    Physical Exam Vitals reviewed.  Constitutional:      General:  She is not in acute distress.    Appearance: She is not diaphoretic.  HENT:     Head: Normocephalic and atraumatic.     Nose: Nose normal.     Mouth/Throat:     Mouth: Mucous membranes are moist.  Eyes:     General: No scleral icterus.    Extraocular Movements: Extraocular movements intact.  Cardiovascular:     Rate and Rhythm: Normal rate and regular rhythm.     Heart sounds: Normal heart sounds. No murmur heard. Pulmonary:     Breath sounds: Normal breath sounds. No wheezing or rales.  Abdominal:     Palpations: Abdomen is soft.  Tenderness: There is no abdominal tenderness.  Musculoskeletal:     Cervical back: Neck supple.     Thoracic back: Tenderness present. Decreased range of motion (Due to pain).     Right lower leg: No edema.     Left lower leg: No edema.  Skin:    General: Skin is warm.     Findings: No rash.     Comments: About 2 cm in diameter bump over scalp area  Neurological:     General: No focal deficit present.     Mental Status: She is alert and oriented to person, place, and time.     Cranial Nerves: No cranial nerve deficit.     Sensory: Sensory deficit (Right index fingertip) present.     Motor: No weakness.  Psychiatric:        Mood and Affect: Mood normal.        Behavior: Behavior normal.     BP 121/73   Pulse 69   Ht 5' 5.5 (1.664 m)   Wt 126 lb 9.6 oz (57.4 kg)   SpO2 99%   BMI 20.75 kg/m  Wt Readings from Last 3 Encounters:  09/24/24 126 lb 9.6 oz (57.4 kg)  09/19/24 125 lb (56.7 kg)  09/12/24 125 lb (56.7 kg)    Lab Results  Component Value Date   TSH 4.630 (H) 05/20/2024   Lab Results  Component Value Date   WBC 4.7 01/10/2024   HGB 14.2 01/10/2024   HCT 43.7 01/10/2024   MCV 92 01/10/2024   PLT 204 01/10/2024   Lab Results  Component Value Date   NA 135 05/20/2024   K 4.2 05/20/2024   CO2 23 05/20/2024   GLUCOSE 80 05/20/2024   BUN 13 05/20/2024   CREATININE 1.03 (H) 05/20/2024   BILITOT 0.4 01/10/2024    ALKPHOS 91 01/10/2024   AST 28 01/10/2024   ALT 22 01/10/2024   PROT 6.7 01/10/2024   ALBUMIN 4.7 01/10/2024   CALCIUM 9.4 05/20/2024   EGFR 57 (L) 05/20/2024   Lab Results  Component Value Date   CHOL 184 01/10/2024   Lab Results  Component Value Date   HDL 85 01/10/2024   Lab Results  Component Value Date   LDLCALC 81 01/10/2024   Lab Results  Component Value Date   TRIG 100 01/10/2024   Lab Results  Component Value Date   CHOLHDL 2.2 01/10/2024   Lab Results  Component Value Date   HGBA1C 5.5 01/10/2024      Assessment & Plan:   Problem List Items Addressed This Visit   None Visit Diagnoses       Encounter for immunization    -  Primary   Relevant Orders   Flu vaccine HIGH DOSE PF(Fluzone Trivalent) (Completed)        No orders of the defined types were placed in this encounter.   Follow-up: No follow-ups on file.    Suzzane MARLA Blanch, MD

## 2024-09-24 NOTE — Assessment & Plan Note (Signed)
 Better controlled with Xanax  dose to 1 mg QPM and 0.5 mg QAM, but she has been taking only QPM dose She prefers to wean herself off of Xanax , which I agree to - decreased dose of Xanax  to 0.5 mg qHS only Added Trazodone  PRN for insomnia Anxiety can also contribute to muscular pain Refilled after reviewing PDMP today

## 2024-09-25 ENCOUNTER — Ambulatory Visit: Payer: Self-pay | Admitting: Internal Medicine

## 2024-09-25 LAB — CMP14+EGFR
ALT: 14 IU/L (ref 0–32)
AST: 23 IU/L (ref 0–40)
Albumin: 4.2 g/dL (ref 3.8–4.8)
Alkaline Phosphatase: 88 IU/L (ref 49–135)
BUN/Creatinine Ratio: 15 (ref 12–28)
BUN: 13 mg/dL (ref 8–27)
Bilirubin Total: 0.5 mg/dL (ref 0.0–1.2)
CO2: 27 mmol/L (ref 20–29)
Calcium: 9.5 mg/dL (ref 8.7–10.3)
Chloride: 101 mmol/L (ref 96–106)
Creatinine, Ser: 0.87 mg/dL (ref 0.57–1.00)
Globulin, Total: 2.2 g/dL (ref 1.5–4.5)
Glucose: 92 mg/dL (ref 70–99)
Potassium: 4.4 mmol/L (ref 3.5–5.2)
Sodium: 142 mmol/L (ref 134–144)
Total Protein: 6.4 g/dL (ref 6.0–8.5)
eGFR: 70 mL/min/1.73 (ref >59)

## 2024-09-25 LAB — TSH+FREE T4
Free T4: 1.22 ng/dL (ref 0.82–1.77)
TSH: 5.69 u[IU]/mL — ABNORMAL HIGH (ref 0.450–4.500)

## 2024-09-26 NOTE — Assessment & Plan Note (Signed)
 Takes Xanax  0.5 mg at bedtime Added Trazodone  PRN for insomnia Her intercostal pain interferes with her sleep

## 2024-09-26 NOTE — Assessment & Plan Note (Addendum)
 Last 2 CMPs showed GFR of 57 and 51 Advised to maintain adequate hydration Avoid nephrotoxic agents Check CBC and CMP

## 2024-09-26 NOTE — Assessment & Plan Note (Signed)
 Intermittent, appears to radiating from LLQ to anterior axillary border Does not seem to be cardiac related pain Unclear etiology, anxiety/stress worsens it - ?Fibromyalgia Previous imaging studies were insignificant including CT chest Has tried Cymbalta, Elavil , Gabapentin  and Lyrica  in the past Had referred to pain clinic - has had intercoastal nerve block and PT without much relief Referred to PM&R clinic in the last visit, had acupuncture treatment without much relief

## 2024-09-26 NOTE — Assessment & Plan Note (Signed)
 Physical exam as documented. Counseling done  re healthy lifestyle involving commitment to 150 minutes exercise per week, heart healthy diet, and attaining healthy weight.The importance of adequate sleep also discussed. Immunization and cancer screening needs are specifically addressed at this visit.

## 2024-10-15 ENCOUNTER — Other Ambulatory Visit: Payer: Self-pay | Admitting: Internal Medicine

## 2024-10-15 DIAGNOSIS — E039 Hypothyroidism, unspecified: Secondary | ICD-10-CM

## 2025-01-22 ENCOUNTER — Ambulatory Visit: Admitting: Internal Medicine

## 2025-09-22 ENCOUNTER — Ambulatory Visit
